# Patient Record
Sex: Male | Born: 1965 | Race: White | Hispanic: No | Marital: Married | Smoking: Never smoker
Health system: Southern US, Community
[De-identification: ages and names within clinical notes are randomized; demographics above are authoritative.]

## PROBLEM LIST (undated history)

## (undated) ENCOUNTER — Emergency Department (HOSPITAL_COMMUNITY): Payer: Self-pay

## (undated) DIAGNOSIS — C801 Malignant (primary) neoplasm, unspecified: Secondary | ICD-10-CM

## (undated) DIAGNOSIS — K5792 Diverticulitis of intestine, part unspecified, without perforation or abscess without bleeding: Secondary | ICD-10-CM

## (undated) DIAGNOSIS — N189 Chronic kidney disease, unspecified: Secondary | ICD-10-CM

## (undated) DIAGNOSIS — N2889 Other specified disorders of kidney and ureter: Secondary | ICD-10-CM

## (undated) DIAGNOSIS — E039 Hypothyroidism, unspecified: Secondary | ICD-10-CM

## (undated) HISTORY — PX: TONSILLECTOMY: SUR1361

## (undated) HISTORY — DX: Chronic kidney disease, unspecified: N18.9

---

## 2010-11-28 HISTORY — PX: THYROIDECTOMY: SHX17

## 2011-01-13 ENCOUNTER — Other Ambulatory Visit: Payer: Self-pay | Admitting: Surgery

## 2011-01-13 ENCOUNTER — Encounter (HOSPITAL_COMMUNITY): Payer: 59

## 2011-01-13 DIAGNOSIS — Z01811 Encounter for preprocedural respiratory examination: Secondary | ICD-10-CM | POA: Insufficient documentation

## 2011-01-13 DIAGNOSIS — Z01812 Encounter for preprocedural laboratory examination: Secondary | ICD-10-CM | POA: Insufficient documentation

## 2011-01-13 LAB — BASIC METABOLIC PANEL
CO2: 28 mEq/L (ref 19–32)
Calcium: 9.8 mg/dL (ref 8.4–10.5)
GFR calc Af Amer: >60 mL/min (ref >60)
GFR calc non Af Amer: >60 mL/min (ref >60)
Potassium: 4.2 mEq/L (ref 3.5–5.1)
Sodium: 141 mEq/L (ref 135–145)

## 2011-01-13 LAB — URINALYSIS, ROUTINE W REFLEX MICROSCOPIC
Hgb urine dipstick: NEGATIVE
Specific Gravity, Urine: 1.024 (ref 1.005–1.030)
Urobilinogen, UA: 0.2 mg/dL (ref 0.0–1.0)
pH: 6 (ref 5.0–8.0)

## 2011-01-13 LAB — DIFFERENTIAL
Lymphs Abs: 3.2 10*3/uL (ref 0.7–4.0)
Monocytes Absolute: 0.7 10*3/uL (ref 0.1–1.0)
Monocytes Relative: 6 % (ref 3–12)
Neutro Abs: 7.5 10*3/uL (ref 1.7–7.7)
Neutrophils Relative %: 64 % (ref 43–77)

## 2011-01-13 LAB — SURGICAL PCR SCREEN
MRSA, PCR: NEGATIVE
Staphylococcus aureus: NEGATIVE

## 2011-01-13 LAB — CBC
HCT: 43.3 % (ref 39.0–52.0)
Hemoglobin: 14.1 g/dL (ref 13.0–17.0)
MCH: 28.2 pg (ref 26.0–34.0)
RBC: 5 MIL/uL (ref 4.22–5.81)

## 2011-01-13 LAB — PROTIME-INR: INR: 0.99 (ref 0.00–1.49)

## 2011-01-20 ENCOUNTER — Other Ambulatory Visit: Payer: Self-pay | Admitting: Surgery

## 2011-01-20 ENCOUNTER — Ambulatory Visit (HOSPITAL_COMMUNITY)
Admission: RE | Admit: 2011-01-20 | Discharge: 2011-01-21 | Disposition: A | Discharge status: Home / Self Care | Payer: 59 | Source: Ambulatory Visit | Attending: Surgery | Admitting: Surgery

## 2011-01-20 DIAGNOSIS — C73 Malignant neoplasm of thyroid gland: Secondary | ICD-10-CM | POA: Insufficient documentation

## 2011-02-01 ENCOUNTER — Other Ambulatory Visit: Payer: Self-pay | Admitting: Surgery

## 2011-02-01 ENCOUNTER — Encounter (HOSPITAL_COMMUNITY): Payer: 59

## 2011-02-01 DIAGNOSIS — Z01812 Encounter for preprocedural laboratory examination: Secondary | ICD-10-CM | POA: Insufficient documentation

## 2011-02-01 LAB — SURGICAL PCR SCREEN
MRSA, PCR: NEGATIVE
Staphylococcus aureus: NEGATIVE

## 2011-02-01 LAB — CBC
Platelets: 344 10*3/uL (ref 150–400)
RBC: 5.06 MIL/uL (ref 4.22–5.81)
WBC: 11.3 10*3/uL — ABNORMAL HIGH (ref 4.0–10.5)

## 2011-02-02 NOTE — Op Note (Addendum)
NAME:  Ralph Garcia, Ralph Garcia NO.:  [id number]  MEDICAL RECORD NO.:  [id number]           PATIENT TYPE:  O  LOCATION:  1531                         FACILITY:  [facility]  PHYSICIAN:  Velora Heckler, MD      DATE OF BIRTH:  [birth year]  DATE OF PROCEDURE:  01/20/2011                               OPERATIVE REPORT   PREOPERATIVE DIAGNOSIS:  Right thyroid nodule with indeterminate cytopathology, 3.8 cm.  POSTOPERATIVE DIAGNOSIS:  Right thyroid nodule with indeterminate cytopathology, 3.8 cm.  PROCEDURE:  Right thyroid lobectomy.  SURGEON:  Velora Heckler, MD, FACS  ANESTHESIA:  General per Dr. Eilene Ghazi.  ESTIMATED BLOOD LOSS:  Minimal.  PREPARATION:  ChloraPrep.  COMPLICATIONS:  None.  INDICATIONS:  The patient is a 45 year old white male from [place], [place].  The patient had a chest x-ray in the emergency department, which showed tracheal deviation.  The patient subsequently underwent thyroid ultrasound in October 2011 showing an enlarged right thyroid lobe with a dominant 3.8 cm hypoechoic mass.  Biopsy was obtained and showed a follicular lesion with increased cellularity.  He was referred for consideration for right thyroid lobectomy for definitive diagnosis.  BODY OF REPORT:  Procedure was done in OR #1 at the [facility].  The patient was brought to the operating room, placed in the supine position on the operating room table.  Following administration of general anesthesia, the patient was positioned and then prepped and draped in the usual strict aseptic fashion.  After ascertaining that an adequate level of anesthesia been achieved, a Kocher incision was made with a #15 blade.  Dissection was carried through subcutaneous tissues and platysma.  Hemostasis was obtained with the electrocautery.  Subplatysmal flaps were developed cephalad and caudad and the Mahorner self-retaining retractor was placed for exposure.  Strap  muscles were incised in the midline.  Left lobe was examined.  It was grossly normal.  On palpation, there were no nodules. There was no lymphadenopathy.  We turned our attention to the right thyroid lobe.  Strap muscles were reflected laterally exposing a large right lobe of the thyroid gland. Middle thyroid vein was divided between Ligaclips with Harmonic scalpel. Superior pole was dissected out and superior pole vessels divided individually between medium Ligaclips with the Harmonic scalpel. Superior parathyroid gland is identified and preserved on its vascular pedicle.  Inferior venous tributaries were divided between medium Ligaclips with the Harmonic scalpel.  Gland is rolled anteriorly. Branches of the inferior thyroid artery are divided between small hemoclips with the Harmonic scalpel.  Recurrent nerve was identified and preserved along its course.  Ligament of Allyson Sabal was released with electrocautery and the gland was mobilized up and onto the anterior trachea.  Gland was completely mobilized off the anterior trachea and across the midline.  There is a moderate-sized pyramidal lobe, which was dissected off the thyroid cartilage and resected on block with the isthmus.  The isthmus was transected at its junction with the left thyroid lobe using the Harmonic scalpel to divide the parenchyma.  Good hemostasis was noted.  Specimen was submitted to  Pathology, labeled right thyroid lobe.  Neck is irrigated with warm saline, which was evacuated.  Good hemostasis was noted.  Surgicel was placed in the operative field. Strap muscles were reapproximated in the midline with interrupted 3-0 Vicryl sutures.  Platysma was closed with interrupted 3-0 Vicryl sutures.  Skin was closed with running 4-0 Monocryl subcuticular suture. Wound was washed and dried and benzoin and Steri-Strips were applied. Sterile dressings were applied.  The patient is awakened from anesthesia and brought to the  recovery room.  The patient tolerated the procedure well.     Velora Heckler, MD, FACS     TMG/MEDQ  D:  01/20/2011  T:  01/21/2011  Job:  [id number]  cc:   Dr. Ky Barban, [place]  Electronically Signed by Darnell Level MD on 02/01/2011 07:36:57 PM

## 2011-02-02 NOTE — Discharge Summary (Addendum)
  NAME:  Ralph Garcia, CARRAS NO.:  [id number]  MEDICAL RECORD NO.:  [id number]           PATIENT TYPE:  O  LOCATION:  1531                         FACILITY:  [facility]  PHYSICIAN:  Velora Heckler, MD      DATE OF BIRTH:  February 02, 1966  DATE OF ADMISSION:  01/20/2011 DATE OF DISCHARGE:  01/21/2011                              DISCHARGE SUMMARY   ADMITTING DIAGNOSIS:  Right thyroid nodule, follicular.  HISTORY OF PRESENT ILLNESS:  The patient is a 45 year old white male from [place], [place].  The patient was found incidentally to have tracheal deviation on chest x-ray.  Ultrasound demonstrated a dominant mass on the right thyroid lobe measuring 3.8 cm in greatest dimension.  Biopsy showed a follicular lesion with minimal colloid.  The patient now comes to surgery for resection for definitive diagnosis.  HOSPITAL COURSE:  The patient was admitted on January 20, 2011.  He was taken directly to the operating room where he underwent right thyroid lobectomy.  Postoperative course was uncomplicated.  The patient remained stable overnight without evidence of bleeding.  He tolerated a diet.  He was prepared for discharge home on the first postoperative day.  DISCHARGE PLANNING:  The patient is discharged home today, January 21, 2011, in good condition.  Discharge medications include Vicodin as needed for pain.  The patient will be seen back in my office at [facility] in 2-3 weeks.  We will obtain a TSH level in approximately 1 month.  FINAL DIAGNOSIS:  Right thyroid nodule, final pathology results pending at the time of discharge.  CONDITION AT DISCHARGE:  Good.     Velora Heckler, MD     TMG/MEDQ  D:  01/21/2011  T:  01/21/2011  Job:  [id number]  cc:   Foye Deer, M.D.  [facility]  Electronically Signed by Darnell Level MD on 02/01/2011 03:21:01 PM Electronically Signed by Darnell Level MD on 02/01/2011 04:54:09 PM Electronically  Signed by Darnell Level MD on 02/01/2011 03:54:23 PM Electronically Signed by Darnell Level MD on 02/01/2011 04:02:06 PM Electronically Signed by Darnell Level MD on 02/01/2011 04:33:15 PM Electronically Signed by Darnell Level MD on 02/01/2011 05:04:43 PM Electronically Signed by Darnell Level MD on 02/01/2011 05:04:43 PM Electronically Signed by Darnell Level MD on 02/01/2011 05:31:53 PM Electronically Signed by Darnell Level MD on 02/01/2011 05:31:53 PM Electronically Signed by Darnell Level MD on 02/01/2011 06:14:19 PM Electronically Signed by Darnell Level MD on 02/01/2011 06:22:00 PM Electronically Signed by Darnell Level MD on 02/01/2011 07:36:57 PM

## 2011-02-07 ENCOUNTER — Ambulatory Visit (HOSPITAL_COMMUNITY)
Admission: RE | Admit: 2011-02-07 | Discharge: 2011-02-08 | Disposition: A | Discharge status: Home / Self Care | Payer: 59 | Source: Ambulatory Visit | Attending: Surgery | Admitting: Surgery

## 2011-02-07 ENCOUNTER — Other Ambulatory Visit: Payer: Self-pay | Admitting: Surgery

## 2011-02-07 DIAGNOSIS — Z01818 Encounter for other preprocedural examination: Secondary | ICD-10-CM | POA: Insufficient documentation

## 2011-02-07 DIAGNOSIS — C73 Malignant neoplasm of thyroid gland: Secondary | ICD-10-CM | POA: Insufficient documentation

## 2011-02-07 DIAGNOSIS — Z01812 Encounter for preprocedural laboratory examination: Secondary | ICD-10-CM | POA: Insufficient documentation

## 2011-02-07 LAB — CALCIUM: Calcium: 9 mg/dL (ref 8.4–10.5)

## 2011-02-18 NOTE — Discharge Summary (Signed)
  NAME:  Ralph Garcia, RETH NO.:  [id number]  MEDICAL RECORD NO.:  [id number]           PATIENT TYPE:  O  LOCATION:  1531                         FACILITY:  [facility]  PHYSICIAN:  Velora Heckler, MD      DATE OF BIRTH:  1966/06/21  DATE OF ADMISSION:  02/07/2011 DATE OF DISCHARGE:  02/08/2011                              DISCHARGE SUMMARY   REASON FOR ADMISSION:  Thyroid carcinoma.  BRIEF HISTORY:  The patient is a 45 year old white male from [place], [place].  He underwent a right thyroid lobectomy on January 20, 2011.  Final pathology showed malignancy.  The patient now returns to the operating room for completion thyroidectomy.  HOSPITAL COURSE:  The patient was admitted on February 07, 2011.  He was taken directly to the operating room where he underwent completion thyroidectomy.  Postoperative course was uncomplicated.  Calcium levels were 9.0 on the evening of surgery and 8.5 on the morning following surgery.  Voice quality was good.  The patient was prepared for discharge home on the first postoperative day.  DISCHARGE PLANNING:  The patient is discharged home today, February 08 2011, in good condition, tolerating a regular diet, and ambulating independently.  DISCHARGE MEDICATIONS:  Include Vicodin as needed for pain and Tums 2 tablets 3 times daily.  The patient will not start on thyroid hormone supplementation until he is evaluated by Dr. Talmage Coin.  The patient will be seen back in my office in 2 weeks.  A calcium level will be checked prior to that office visit.  FOLLOWUP:  Consultation With Dr. Talmage Coin of [facility] will be arranged in the immediate future.  FINAL DIAGNOSIS:  Thyroid carcinoma.  CONDITION ON DISCHARGE:  Good.     Velora Heckler, MD     TMG/MEDQ  D:  02/08/2011  T:  02/08/2011  Job:  [id number]  cc:   Dr. Foye Deer  Electronically Signed by Darnell Level MD on 02/18/2011 09:18:04 AM

## 2011-02-18 NOTE — Op Note (Signed)
NAME:  Ralph Garcia, Ralph Garcia NO.:  [id number]  MEDICAL RECORD NO.:  [id number]           PATIENT TYPE:  O  LOCATION:  DAYL                         FACILITY:  [facility]  PHYSICIAN:  Velora Heckler, MD      DATE OF BIRTH:  Aug 14, 1966  DATE OF PROCEDURE: DATE OF DISCHARGE:                              OPERATIVE REPORT   PREOPERATIVE DIAGNOSIS:  Thyroid carcinoma.  POSTOPERATIVE DIAGNOSIS:  Thyroid carcinoma.  PROCEDURE:  Completion thyroidectomy.  SURGEON:  Velora Heckler, MD, FACS  ASSISTANT:  Cyndia Bent, MD  ANESTHESIA:  General per Dr. Eilene Ghazi.  ESTIMATED BLOOD LOSS:  Minimal.  PREPARATION:  ChloraPrep.  COMPLICATIONS:  None.  INDICATIONS:  Patient is a 45 year old white male from [place], [place].  He presented initially with an enlarged right thyroid lobe. It was resected on February 23.  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  The patient now returns to the operating room for completion thyroidectomy to facilitate adjuvant treatment with radioactive iodine.  PROCEDURE IN DETAIL:  Procedure was done in OR #11 at the [facility].  The patient was brought to the operating room, placed in supine position on the operating room table.  Following administration of general anesthesia, the patient is positioned and then prepped and draped in the usual strict aseptic fashion.  After ascertaining that an adequate level of anesthesia been achieved, the patient's previous Kocher incision was reopened with a #15 blade. Dissection was carried through subcutaneous tissues.  Small seroma is evacuated from in front of the strap muscles.  Planes are redeveloped and Mahorner self-retaining retractor was placed for exposure.  Strap muscles were again incised in the midline.  Again, a small seroma was noted anterior to the airway.  Strap muscles were opened widely.  Strap muscles were reflected  to the left exposing the left thyroid lobe. There was moderate scarring.  This was freed with electrocautery.  Strap muscles were reflected further laterally exposing the left lobe.  It was gently dissected out.  Superior pole vessels were divided individually between small and medium Ligaclips with a harmonic scalpel.  Gland is rolled anteriorly.  Superior parathyroid gland was identified and preserved on its vascular pedicle.  Branches of the inferior thyroid artery were divided between small Ligaclips with the harmonic scalpel. Inferior venous tributaries were divided between small Ligaclips with the harmonic scalpel.  Gland is rolled anteriorly.  Ligament of Allyson Sabal was released with electrocautery.  A small amount of residual thyroid tissue was left in situ immediately adjacent to the recurrent nerve. Remainder of the gland was then excised off the airway with electrocautery used for hemostasis.  Left thyroid lobe was submitted to pathology for permanent review.  Neck was irrigated with warm saline.  Good hemostasis was noted.  Surgicel was placed in the operative field.  Strap muscles were reapproximated in the midline with interrupted 3-0 Vicryl sutures.  Platysma was closed with interrupted 3-0 Vicryl sutures.  Skin was closed with running 4-0 Monocryl subcuticular suture.  Wound is washed and dried and Benzoin and Steri-Strips  were applied.  Sterile dressings were applied.  The patient is awakened from anesthesia and brought to the recovery room.  The patient tolerated the procedure well.   Velora Heckler, MD, FACS     TMG/MEDQ  D:  02/07/2011  T:  02/07/2011  Job:  [id number]  cc:   Dr. Foye Deer  Tonita Cong, M.D. Fax: [phone]  Electronically Signed by Darnell Level MD on 02/18/2011 09:18:34 AM

## 2011-02-22 ENCOUNTER — Other Ambulatory Visit (HOSPITAL_COMMUNITY): Payer: Self-pay | Admitting: Internal Medicine

## 2011-02-22 DIAGNOSIS — E0590 Thyrotoxicosis, unspecified without thyrotoxic crisis or storm: Secondary | ICD-10-CM

## 2011-02-22 DIAGNOSIS — C73 Malignant neoplasm of thyroid gland: Secondary | ICD-10-CM

## 2011-03-03 ENCOUNTER — Other Ambulatory Visit (HOSPITAL_COMMUNITY): Payer: Self-pay | Admitting: Internal Medicine

## 2011-03-03 DIAGNOSIS — C73 Malignant neoplasm of thyroid gland: Secondary | ICD-10-CM

## 2011-03-16 ENCOUNTER — Other Ambulatory Visit (HOSPITAL_COMMUNITY): Payer: Self-pay | Admitting: Internal Medicine

## 2011-03-16 DIAGNOSIS — C73 Malignant neoplasm of thyroid gland: Secondary | ICD-10-CM

## 2011-03-21 ENCOUNTER — Encounter (HOSPITAL_COMMUNITY)
Admission: RE | Admit: 2011-03-21 | Discharge: 2011-03-21 | Disposition: A | Discharge status: Home / Self Care | Payer: 59 | Source: Ambulatory Visit | Attending: Internal Medicine | Admitting: Internal Medicine

## 2011-03-21 DIAGNOSIS — C73 Malignant neoplasm of thyroid gland: Secondary | ICD-10-CM

## 2011-03-22 ENCOUNTER — Encounter (HOSPITAL_COMMUNITY): Payer: 59

## 2011-03-22 ENCOUNTER — Ambulatory Visit (HOSPITAL_COMMUNITY): Payer: 59

## 2011-03-22 ENCOUNTER — Encounter (HOSPITAL_COMMUNITY)
Admission: RE | Admit: 2011-03-22 | Discharge: 2011-03-22 | Disposition: A | Discharge status: Home / Self Care | Payer: 59 | Source: Ambulatory Visit | Attending: Internal Medicine | Admitting: Internal Medicine

## 2011-03-22 DIAGNOSIS — C73 Malignant neoplasm of thyroid gland: Secondary | ICD-10-CM | POA: Insufficient documentation

## 2011-03-23 ENCOUNTER — Encounter (HOSPITAL_COMMUNITY)
Admission: RE | Admit: 2011-03-23 | Discharge: 2011-03-23 | Disposition: A | Discharge status: Home / Self Care | Payer: 59 | Source: Ambulatory Visit | Attending: Internal Medicine | Admitting: Internal Medicine

## 2011-03-23 DIAGNOSIS — C73 Malignant neoplasm of thyroid gland: Secondary | ICD-10-CM | POA: Insufficient documentation

## 2011-03-23 MED ORDER — SODIUM IODIDE I 131 CAPSULE
107.6000 | Freq: Once | INTRAVENOUS | Status: AC | PRN
  Administered 2011-03-23: 107.6 via ORAL

## 2011-03-28 ENCOUNTER — Encounter (INDEPENDENT_AMBULATORY_CARE_PROVIDER_SITE_OTHER): Payer: Self-pay | Admitting: Surgery

## 2011-04-01 ENCOUNTER — Ambulatory Visit (HOSPITAL_COMMUNITY)
Admission: RE | Admit: 2011-04-01 | Discharge: 2011-04-01 | Disposition: A | Discharge status: Home / Self Care | Payer: 59 | Source: Ambulatory Visit | Attending: Internal Medicine | Admitting: Internal Medicine

## 2011-04-01 DIAGNOSIS — C73 Malignant neoplasm of thyroid gland: Secondary | ICD-10-CM | POA: Insufficient documentation

## 2011-08-08 ENCOUNTER — Ambulatory Visit (INDEPENDENT_AMBULATORY_CARE_PROVIDER_SITE_OTHER): Payer: 59 | Admitting: Surgery

## 2011-08-10 ENCOUNTER — Encounter (INDEPENDENT_AMBULATORY_CARE_PROVIDER_SITE_OTHER): Payer: Self-pay | Admitting: Surgery

## 2011-09-30 ENCOUNTER — Other Ambulatory Visit: Payer: Self-pay | Admitting: Internal Medicine

## 2011-09-30 DIAGNOSIS — C73 Malignant neoplasm of thyroid gland: Secondary | ICD-10-CM

## 2011-10-04 ENCOUNTER — Ambulatory Visit
Admission: RE | Admit: 2011-10-04 | Discharge: 2011-10-04 | Disposition: A | Discharge status: Home / Self Care | Payer: 59 | Source: Ambulatory Visit | Attending: Internal Medicine | Admitting: Internal Medicine

## 2011-10-04 DIAGNOSIS — C73 Malignant neoplasm of thyroid gland: Secondary | ICD-10-CM

## 2012-10-18 ENCOUNTER — Other Ambulatory Visit: Payer: Self-pay | Admitting: Internal Medicine

## 2012-10-18 DIAGNOSIS — C73 Malignant neoplasm of thyroid gland: Secondary | ICD-10-CM

## 2012-10-29 ENCOUNTER — Ambulatory Visit
Admission: RE | Admit: 2012-10-29 | Discharge: 2012-10-29 | Disposition: A | Discharge status: Home / Self Care | Payer: 59 | Source: Ambulatory Visit | Attending: Internal Medicine | Admitting: Internal Medicine

## 2012-10-29 DIAGNOSIS — C73 Malignant neoplasm of thyroid gland: Secondary | ICD-10-CM

## 2013-03-28 ENCOUNTER — Other Ambulatory Visit: Payer: Self-pay | Admitting: Urology

## 2013-04-17 ENCOUNTER — Encounter (HOSPITAL_COMMUNITY): Payer: Self-pay | Admitting: Pharmacy Technician

## 2013-04-26 ENCOUNTER — Ambulatory Visit (HOSPITAL_COMMUNITY)
Admission: RE | Admit: 2013-04-26 | Discharge: 2013-04-26 | Disposition: A | Discharge status: Home / Self Care | Payer: 59 | Source: Ambulatory Visit | Attending: Urology | Admitting: Urology

## 2013-04-26 ENCOUNTER — Encounter (HOSPITAL_COMMUNITY): Payer: Self-pay

## 2013-04-26 ENCOUNTER — Encounter (HOSPITAL_COMMUNITY)
Admission: RE | Admit: 2013-04-26 | Discharge: 2013-04-26 | Disposition: A | Discharge status: Home / Self Care | Payer: 59 | Source: Ambulatory Visit | Attending: Urology | Admitting: Urology

## 2013-04-26 DIAGNOSIS — I10 Essential (primary) hypertension: Secondary | ICD-10-CM | POA: Insufficient documentation

## 2013-04-26 DIAGNOSIS — Z01818 Encounter for other preprocedural examination: Secondary | ICD-10-CM | POA: Insufficient documentation

## 2013-04-26 DIAGNOSIS — Z01812 Encounter for preprocedural laboratory examination: Secondary | ICD-10-CM | POA: Insufficient documentation

## 2013-04-26 HISTORY — DX: Malignant (primary) neoplasm, unspecified: C80.1

## 2013-04-26 HISTORY — DX: Hypothyroidism, unspecified: E03.9

## 2013-04-26 LAB — CBC
HCT: 40.2 % (ref 39.0–52.0)
MCHC: 33.6 g/dL (ref 30.0–36.0)
MCV: 85.2 fL (ref 78.0–100.0)
RDW: 12.3 % (ref 11.5–15.5)

## 2013-04-26 LAB — BASIC METABOLIC PANEL
BUN: 14 mg/dL (ref 6–23)
Calcium: 8.9 mg/dL (ref 8.4–10.5)
Chloride: 105 mEq/L (ref 96–112)
Creatinine, Ser: 0.86 mg/dL (ref 0.50–1.35)
GFR calc Af Amer: >90 mL/min (ref >90)

## 2013-04-26 LAB — SURGICAL PCR SCREEN: MRSA, PCR: NEGATIVE

## 2013-04-26 NOTE — Patient Instructions (Addendum)
[address] PEPLINSKI  04/26/2013   Your procedure is scheduled on: 05/02/13  Report to [facility] at 0515 AM.  Call this number if you have problems the morning of surgery [phone]   Remember:   Do not eat food or drink liquids After Midnight.     Take these medicines the morning of surgery with A SIP OF WATER: synthroid   Do not wear jewelry, make-up or nail polish.  Do not wear lotions, powders, or perfumes. You may wear deodorant.  Do not shave 48 hours prior to surgery. Men may shave face and neck.  Do not bring valuables to the hospital.  Contacts, dentures or bridgework may not be worn into surgery.  Leave suitcase in the car. After surgery it may be brought to your room.  For patients admitted to the hospital, checkout time is 11:00 AM the day of discharge.    Please read over the following fact sheets that you were given: MRSA Information, blood fact sheet, incentive spirometry fact sheet  Birdie Sons, RN  pre op nurse call if needed [phone]    FAILURE TO FOLLOW THESE INSTRUCTIONS MAY RESULT IN CANCELLATION OF YOUR SURGERY   Patient Signature: ___________________________________________

## 2013-04-26 NOTE — Progress Notes (Signed)
EKG 07/21/12 on chart

## 2013-05-01 NOTE — Anesthesia Preprocedure Evaluation (Addendum)
Anesthesia Evaluation  Patient identified by MRN, date of birth, ID band Patient awake    Reviewed: Allergy & Precautions, H&P , NPO status , Patient's Chart, lab work & pertinent test results  Airway Mallampati: II TM Distance: >3 FB Neck ROM: Full    Dental  (+) Teeth Intact and Dental Advisory Given   Pulmonary neg pulmonary ROS,  breath sounds clear to auscultation  Pulmonary exam normal       Cardiovascular negative cardio ROS  Rhythm:Regular Rate:Normal     Neuro/Psych negative neurological ROS  negative psych ROS   GI/Hepatic negative GI ROS, Neg liver ROS,   Endo/Other  Hypothyroidism Thyroid CA  Renal/GU negative Renal ROS  negative genitourinary   Musculoskeletal negative musculoskeletal ROS (+)   Abdominal   Peds  Hematology negative hematology ROS (+)   Anesthesia Other Findings   Reproductive/Obstetrics                          Anesthesia Physical Anesthesia Plan  ASA: III  Anesthesia Plan: General   Post-op Pain Management:    Induction: Intravenous  Airway Management Planned: Oral ETT  Additional Equipment:   Intra-op Plan:   Post-operative Plan: Extubation in OR  Informed Consent: I have reviewed the patients History and Physical, chart, labs and discussed the procedure including the risks, benefits and alternatives for the proposed anesthesia with the patient or authorized representative who has indicated his/her understanding and acceptance.   Dental advisory given  Plan Discussed with: CRNA  Anesthesia Plan Comments:         Anesthesia Quick Evaluation

## 2013-05-01 NOTE — H&P (Signed)
  History of Present Illness  Ralph Garcia is a 46 year old who was initially evaluated for an elevated PSA of 3.6 in October 2013. This was repeated and remained elevated at 4.57.  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 He has no family history of prostate cancer but does have a maternal family history of breast cancer with his mother having been diagnosed at an early age and ultimately dying of metastatic breast cancer.   Overall he his healthy with minimal comorbidities.  TNM stage: cT1c Nx Mx PSA: 4.57 Gleason score: 3+3=6 Biopsy (03/06/13): 3/12 cores positive    Left: L lateral apex (< 5%)    Right: R mid (5%), R lateral mid (5%) Prostate volume: 59.4 cc PSAD: 0.08  Nomogram: OC disease: 89% EPE: 8% SVI: 1% LNI: 1.3% PFS (surgery): 98% at 5 years, 97% at 10 years DSS: 99% at 5 years, 99% at 10 years  Urinary function: He has baseline urinary urgency, frequency,and urge incontinence. IPSS is 10. Erectile function: SHIM score is 21.  Past Medical History Problems  1. History of  Thyroid Cancer V10.87  Surgical History Problems  1. History of  Thyroid Surgery Total Thyroidectomy 2. History of  Tonsillectomy  Current Meds 1. Levothyroxine Sodium 175 MCG Oral Tablet; Therapy: 20Nov2013 to  Allergies Medication  1. Erythromycin GEL 2. Neomycin-Polymyxin-Bacitracin OINT  Family History Problems  1. Maternal history of  Breast Cancer V16.3 2. Paternal history of  Multiple Myeloma  Social History Problems  1. Marital History - Currently Married 2. Never A Smoker Denied  3. History of  Alcohol Use  Vitals Vital Signs [Data Includes: Last 1 Day]  29Apr2014 08:59AM  Blood Pressure: 131 / 75 Temperature: 98.6 F Heart Rate: 73  Physical Exam Constitutional: Well nourished and well developed  . No acute distress.  Pulmonary: No respiratory distress and normal respiratory rhythm and effort.  Cardiovascular: Heart rate and rhythm are normal . No peripheral edema.  Abdomen: The abdomen is soft and nontender. No masses are palpated.  Neuro/Psych:. Mood and affect are appropriate.    Assessment Assessed  1. Prostate Cancer 185    Discussion/Summary  1. Prostate cancer: He has elected to undergo surgical therapy and will proceed with a robotic-assisted laparoscopic radical prostatectomy. I discussed the potential benefits and risks of the procedure, side effects of the proposed treatment, the likelihood of the patient achieving the goals of the procedure, and any potential problems that might occur during the procedure or recuperation.

## 2013-05-02 ENCOUNTER — Ambulatory Visit (HOSPITAL_COMMUNITY): Payer: 59 | Admitting: Anesthesiology

## 2013-05-02 ENCOUNTER — Encounter (HOSPITAL_COMMUNITY): Payer: Self-pay | Admitting: *Deleted

## 2013-05-02 ENCOUNTER — Encounter (HOSPITAL_COMMUNITY): Payer: Self-pay | Admitting: Anesthesiology

## 2013-05-02 ENCOUNTER — Encounter (HOSPITAL_COMMUNITY)
Admission: RE | Disposition: A | Discharge status: Home / Self Care | Payer: Self-pay | Source: Ambulatory Visit | Attending: Urology

## 2013-05-02 ENCOUNTER — Inpatient Hospital Stay (HOSPITAL_COMMUNITY)
Admission: RE | Admit: 2013-05-02 | Discharge: 2013-05-03 | DRG: 708 | Disposition: A | Discharge status: Home / Self Care | Payer: 59 | Source: Ambulatory Visit | Attending: Urology | Admitting: Urology

## 2013-05-02 DIAGNOSIS — N3941 Urge incontinence: Secondary | ICD-10-CM | POA: Diagnosis present

## 2013-05-02 DIAGNOSIS — Z803 Family history of malignant neoplasm of breast: Secondary | ICD-10-CM

## 2013-05-02 DIAGNOSIS — C61 Malignant neoplasm of prostate: Principal | ICD-10-CM | POA: Diagnosis present

## 2013-05-02 DIAGNOSIS — E0789 Other specified disorders of thyroid: Secondary | ICD-10-CM | POA: Diagnosis present

## 2013-05-02 DIAGNOSIS — Z85850 Personal history of malignant neoplasm of thyroid: Secondary | ICD-10-CM

## 2013-05-02 HISTORY — PX: ROBOT ASSISTED LAPAROSCOPIC RADICAL PROSTATECTOMY: SHX5141

## 2013-05-02 LAB — ABO/RH: ABO/RH(D): A POS

## 2013-05-02 LAB — TYPE AND SCREEN
ABO/RH(D): A POS
Antibody Screen: NEGATIVE

## 2013-05-02 SURGERY — ROBOTIC ASSISTED LAPAROSCOPIC RADICAL PROSTATECTOMY LEVEL 1
Anesthesia: General | Site: Pelvis | Wound class: Clean Contaminated

## 2013-05-02 MED ORDER — STERILE WATER FOR IRRIGATION IR SOLN
Status: DC | PRN
  Administered 2013-05-02: 1500 mL

## 2013-05-02 MED ORDER — FENTANYL CITRATE 0.05 MG/ML IJ SOLN
INTRAMUSCULAR | Status: DC | PRN
  Administered 2013-05-02: 150 ug via INTRAVENOUS
  Administered 2013-05-02: 50 ug via INTRAVENOUS

## 2013-05-02 MED ORDER — SODIUM CHLORIDE 0.9 % IV BOLUS (SEPSIS)
1000.0000 mL | Freq: Once | INTRAVENOUS | Status: AC
  Administered 2013-05-02: 1000 mL via INTRAVENOUS

## 2013-05-02 MED ORDER — PROMETHAZINE HCL 25 MG/ML IJ SOLN: 6.2500 mg | INTRAMUSCULAR | Status: DC | PRN

## 2013-05-02 MED ORDER — DIPHENHYDRAMINE HCL 50 MG/ML IJ SOLN: 12.5000 mg | Freq: Four times a day (QID) | INTRAMUSCULAR | Status: DC | PRN

## 2013-05-02 MED ORDER — BUPIVACAINE-EPINEPHRINE 0.25% -1:200000 IJ SOLN
INTRAMUSCULAR | Status: DC | PRN
  Administered 2013-05-02: 30 mL

## 2013-05-02 MED ORDER — CEFAZOLIN SODIUM 1-5 GM-% IV SOLN
1.0000 g | Freq: Three times a day (TID) | INTRAVENOUS | Status: AC
  Administered 2013-05-02 (×2): 1 g via INTRAVENOUS
  Filled 2013-05-02 (×2): qty 50

## 2013-05-02 MED ORDER — ONDANSETRON HCL 4 MG/2ML IJ SOLN
INTRAMUSCULAR | Status: DC | PRN
  Administered 2013-05-02: 4 mg via INTRAVENOUS

## 2013-05-02 MED ORDER — DOCUSATE SODIUM 100 MG PO CAPS
100.0000 mg | ORAL_CAPSULE | Freq: Two times a day (BID) | ORAL | Status: DC
  Administered 2013-05-02 – 2013-05-03 (×3): 100 mg via ORAL
  Filled 2013-05-02 (×4): qty 1

## 2013-05-02 MED ORDER — KCL IN DEXTROSE-NACL 20-5-0.45 MEQ/L-%-% IV SOLN
INTRAVENOUS | Status: DC
  Administered 2013-05-02 (×3): via INTRAVENOUS
  Filled 2013-05-02 (×5): qty 1000

## 2013-05-02 MED ORDER — CIPROFLOXACIN HCL 500 MG PO TABS: 500.0000 mg | ORAL_TABLET | Freq: Two times a day (BID) | ORAL | Status: DC

## 2013-05-02 MED ORDER — HEMOSTATIC AGENTS (NO CHARGE) OPTIME
TOPICAL | Status: DC | PRN
  Administered 2013-05-02: 1 via TOPICAL

## 2013-05-02 MED ORDER — DIPHENHYDRAMINE HCL 12.5 MG/5ML PO ELIX: 12.5000 mg | ORAL_SOLUTION | Freq: Four times a day (QID) | ORAL | Status: DC | PRN

## 2013-05-02 MED ORDER — HYDROCODONE-ACETAMINOPHEN 5-325 MG PO TABS: 1.0000 | ORAL_TABLET | Freq: Four times a day (QID) | ORAL | Status: DC | PRN

## 2013-05-02 MED ORDER — GLYCOPYRROLATE 0.2 MG/ML IJ SOLN
INTRAMUSCULAR | Status: DC | PRN
  Administered 2013-05-02: 0.4 mg via INTRAVENOUS

## 2013-05-02 MED ORDER — MORPHINE SULFATE 2 MG/ML IJ SOLN
2.0000 mg | INTRAMUSCULAR | Status: DC | PRN
  Administered 2013-05-02: 2 mg via INTRAVENOUS
  Filled 2013-05-02: qty 1

## 2013-05-02 MED ORDER — NEOSTIGMINE METHYLSULFATE 1 MG/ML IJ SOLN
INTRAMUSCULAR | Status: DC | PRN
  Administered 2013-05-02: 2 mg via INTRAVENOUS

## 2013-05-02 MED ORDER — KCL IN DEXTROSE-NACL 20-5-0.45 MEQ/L-%-% IV SOLN
INTRAVENOUS | Status: AC
  Filled 2013-05-02: qty 1000

## 2013-05-02 MED ORDER — KETOROLAC TROMETHAMINE 15 MG/ML IJ SOLN
15.0000 mg | Freq: Four times a day (QID) | INTRAMUSCULAR | Status: DC
  Administered 2013-05-02 – 2013-05-03 (×4): 15 mg via INTRAVENOUS
  Filled 2013-05-02 (×6): qty 1

## 2013-05-02 MED ORDER — SODIUM CHLORIDE 0.9 % IR SOLN
Status: DC | PRN
  Administered 2013-05-02: 1 via INTRAVESICAL

## 2013-05-02 MED ORDER — ACETAMINOPHEN 325 MG PO TABS: 650.0000 mg | ORAL_TABLET | ORAL | Status: DC | PRN

## 2013-05-02 MED ORDER — INDIGOTINDISULFONATE SODIUM 8 MG/ML IJ SOLN
INTRAMUSCULAR | Status: DC | PRN
  Administered 2013-05-02: 5 mL via INTRAVENOUS

## 2013-05-02 MED ORDER — CEFAZOLIN SODIUM-DEXTROSE 2-3 GM-% IV SOLR
INTRAVENOUS | Status: AC
  Filled 2013-05-02: qty 50

## 2013-05-02 MED ORDER — SUCCINYLCHOLINE CHLORIDE 20 MG/ML IJ SOLN
INTRAMUSCULAR | Status: DC | PRN
  Administered 2013-05-02: 100 mg via INTRAVENOUS

## 2013-05-02 MED ORDER — HEPARIN SODIUM (PORCINE) 1000 UNIT/ML IJ SOLN
INTRAMUSCULAR | Status: AC
  Filled 2013-05-02: qty 1

## 2013-05-02 MED ORDER — INDIGOTINDISULFONATE SODIUM 8 MG/ML IJ SOLN
INTRAMUSCULAR | Status: AC
  Filled 2013-05-02: qty 10

## 2013-05-02 MED ORDER — PROPOFOL 10 MG/ML IV BOLUS
INTRAVENOUS | Status: DC | PRN
  Administered 2013-05-02: 200 mg via INTRAVENOUS

## 2013-05-02 MED ORDER — HYDROMORPHONE HCL PF 1 MG/ML IJ SOLN
INTRAMUSCULAR | Status: AC
  Filled 2013-05-02: qty 1

## 2013-05-02 MED ORDER — HYDROMORPHONE HCL PF 1 MG/ML IJ SOLN
INTRAMUSCULAR | Status: DC | PRN
  Administered 2013-05-02 (×2): 1 mg via INTRAVENOUS

## 2013-05-02 MED ORDER — ACETAMINOPHEN 10 MG/ML IV SOLN
INTRAVENOUS | Status: AC
  Filled 2013-05-02: qty 100

## 2013-05-02 MED ORDER — LIDOCAINE HCL 1 % IJ SOLN
INTRAMUSCULAR | Status: DC | PRN
  Administered 2013-05-02: 100 mg via INTRADERMAL

## 2013-05-02 MED ORDER — MIDAZOLAM HCL 5 MG/5ML IJ SOLN
INTRAMUSCULAR | Status: DC | PRN
  Administered 2013-05-02: 2 mg via INTRAVENOUS

## 2013-05-02 MED ORDER — ACETAMINOPHEN 10 MG/ML IV SOLN
INTRAVENOUS | Status: DC | PRN
  Administered 2013-05-02: 1000 mg via INTRAVENOUS

## 2013-05-02 MED ORDER — LACTATED RINGERS IV SOLN: INTRAVENOUS | Status: DC

## 2013-05-02 MED ORDER — CEFAZOLIN SODIUM-DEXTROSE 2-3 GM-% IV SOLR
2.0000 g | INTRAVENOUS | Status: AC
  Administered 2013-05-02: 2 g via INTRAVENOUS

## 2013-05-02 MED ORDER — LACTATED RINGERS IV SOLN
INTRAVENOUS | Status: DC | PRN
  Administered 2013-05-02: 09:00:00

## 2013-05-02 MED ORDER — HYDROMORPHONE HCL PF 1 MG/ML IJ SOLN
0.2500 mg | INTRAMUSCULAR | Status: DC | PRN
  Administered 2013-05-02 (×2): 0.5 mg via INTRAVENOUS

## 2013-05-02 MED ORDER — BUPIVACAINE-EPINEPHRINE PF 0.25-1:200000 % IJ SOLN
INTRAMUSCULAR | Status: AC
  Filled 2013-05-02: qty 30

## 2013-05-02 MED ORDER — LACTATED RINGERS IV SOLN
INTRAVENOUS | Status: DC | PRN
  Administered 2013-05-02 (×2): via INTRAVENOUS

## 2013-05-02 MED ORDER — CISATRACURIUM BESYLATE (PF) 10 MG/5ML IV SOLN
INTRAVENOUS | Status: DC | PRN
  Administered 2013-05-02: 6 mg via INTRAVENOUS
  Administered 2013-05-02: 10 mg via INTRAVENOUS

## 2013-05-02 MED ORDER — LEVOTHYROXINE SODIUM 175 MCG PO TABS
175.0000 ug | ORAL_TABLET | Freq: Every day | ORAL | Status: DC
  Filled 2013-05-02 (×2): qty 1

## 2013-05-02 MED ORDER — INDIGOTINDISULFONATE SODIUM 8 MG/ML IJ SOLN: INTRAMUSCULAR | Status: DC | PRN

## 2013-05-02 SURGICAL SUPPLY — 44 items
CANISTER SUCTION 2500CC (MISCELLANEOUS) ×2 IMPLANT
CATH FOLEY 2WAY SLVR 18FR 30CC (CATHETERS) ×2 IMPLANT
CATH ROBINSON RED A/P 16FR (CATHETERS) ×2 IMPLANT
CATH ROBINSON RED A/P 8FR (CATHETERS) ×2 IMPLANT
CATH TIEMANN FOLEY 18FR 5CC (CATHETERS) ×2 IMPLANT
CHLORAPREP W/TINT 26ML (MISCELLANEOUS) ×2 IMPLANT
CLIP LIGATING HEM O LOK PURPLE (MISCELLANEOUS) IMPLANT
CLOTH BEACON ORANGE TIMEOUT ST (SAFETY) ×2 IMPLANT
CORD HIGH FREQUENCY UNIPOLAR (ELECTROSURGICAL) ×2 IMPLANT
COVER SURGICAL LIGHT HANDLE (MISCELLANEOUS) ×2 IMPLANT
COVER TIP SHEARS 8 DVNC (MISCELLANEOUS) ×1 IMPLANT
COVER TIP SHEARS 8MM DA VINCI (MISCELLANEOUS) ×1
CUTTER ECHEON FLEX ENDO 45 340 (ENDOMECHANICALS) ×2 IMPLANT
DECANTER SPIKE VIAL GLASS SM (MISCELLANEOUS) IMPLANT
DRAPE SURG IRRIG POUCH 19X23 (DRAPES) ×2 IMPLANT
DRSG TEGADERM 2-3/8X2-3/4 SM (GAUZE/BANDAGES/DRESSINGS) ×8 IMPLANT
DRSG TEGADERM 4X4.75 (GAUZE/BANDAGES/DRESSINGS) ×4 IMPLANT
DRSG TEGADERM 6X8 (GAUZE/BANDAGES/DRESSINGS) ×4 IMPLANT
ELECT REM PT RETURN 9FT ADLT (ELECTROSURGICAL) ×2
ELECTRODE REM PT RTRN 9FT ADLT (ELECTROSURGICAL) ×1 IMPLANT
GAUZE SPONGE 2X2 8PLY STRL LF (GAUZE/BANDAGES/DRESSINGS) ×1 IMPLANT
GLOVE BIO SURGEON STRL SZ 6.5 (GLOVE) ×2 IMPLANT
GLOVE BIOGEL M STRL SZ7.5 (GLOVE) ×4 IMPLANT
GOWN STRL NON-REIN LRG LVL3 (GOWN DISPOSABLE) ×4 IMPLANT
GOWN STRL REIN XL XLG (GOWN DISPOSABLE) ×4 IMPLANT
HEMOSTAT SURGICEL 2X14 (HEMOSTASIS) ×2 IMPLANT
HOLDER FOLEY CATH W/STRAP (MISCELLANEOUS) ×2 IMPLANT
IV LACTATED RINGERS 1000ML (IV SOLUTION) ×2 IMPLANT
KIT ACCESSORY DA VINCI DISP (KITS) ×1
KIT ACCESSORY DVNC DISP (KITS) ×1 IMPLANT
NDL SAFETY ECLIPSE 18X1.5 (NEEDLE) ×1 IMPLANT
NEEDLE HYPO 18GX1.5 SHARP (NEEDLE) ×1
PACK ROBOT UROLOGY CUSTOM (CUSTOM PROCEDURE TRAY) ×2 IMPLANT
RELOAD GREEN ECHELON 45 (STAPLE) ×2 IMPLANT
SET TUBE IRRIG SUCTION NO TIP (IRRIGATION / IRRIGATOR) ×2 IMPLANT
SOLUTION ELECTROLUBE (MISCELLANEOUS) ×2 IMPLANT
SPONGE GAUZE 2X2 STER 10/PKG (GAUZE/BANDAGES/DRESSINGS) ×1
SUT ETHILON 3 0 PS 1 (SUTURE) ×2 IMPLANT
SUT MNCRL 3 0 RB1 (SUTURE) ×1 IMPLANT
SUT MONOCRYL 3 0 RB1 (SUTURE) ×1
SUT VICRYL 0 UR6 27IN ABS (SUTURE) ×4 IMPLANT
SYR 27GX1/2 1ML LL SAFETY (SYRINGE) ×2 IMPLANT
TOWEL OR NON WOVEN STRL DISP B (DISPOSABLE) ×2 IMPLANT
WATER STERILE IRR 1500ML POUR (IV SOLUTION) ×4 IMPLANT

## 2013-05-02 NOTE — Progress Notes (Signed)
Patient ID: Ralph Garcia, male   DOB: [birth year], 47 y.o.   MRN: [id number] Post-op note  Subjective: The patient is doing well.  No complaints.  Denies N/V  Objective: Vital signs in last 24 hours: Temp:  [97.5 F (36.4 C)-98.3 F (36.8 C)] 97.6 F (36.4 C) (06/05 1040) Pulse Rate:  [51-70] 57 (06/05 1040) Resp:  [10-18] 14 (06/05 1040) BP: (123-135)/(68-81) 133/81 mmHg (06/05 1040) SpO2:  [97 %-100 %] 100 % (06/05 1040)  Intake/Output from previous day:   Intake/Output this shift: Total I/O In: 1200 [I.V.:1200] Out: 155 [Urine:60; Drains:20; Blood:75]  Physical Exam:  General: Alert and oriented. Abdomen: Soft, Nondistended. Incisions: Clean and dry.  Lab Results:  Recent Labs  05/02/13 0940  HGB 14.1  HCT 42.1    Assessment/Plan: POD#0   1) Continue to monitor 2) DVT prophy, amb, IS, pain control     LOS: 0 days   YARBROUGH,Janene Yousuf G. 05/02/2013, 1:18 PM

## 2013-05-02 NOTE — Anesthesia Procedure Notes (Signed)
Procedure Name: Intubation Date/Time: 05/02/2013 7:27 AM Performed by: Einar Pheasant Pre-anesthesia Checklist: Patient identified, Emergency Drugs available, Suction available, Patient being monitored and Timeout performed Patient Re-evaluated:Patient Re-evaluated prior to inductionOxygen Delivery Method: Circle system utilized Preoxygenation: Pre-oxygenation with 100% oxygen Intubation Type: IV induction Ventilation: Mask ventilation without difficulty Laryngoscope Size: Mac and 3 Grade View: Grade I Tube type: Oral Tube size: 8.0 mm Number of attempts: 1 Airway Equipment and Method: Patient positioned with wedge pillow Placement Confirmation: ETT inserted through vocal cords under direct vision Secured at: 23 cm Tube secured with: Tape Dental Injury: Teeth and Oropharynx as per pre-operative assessment

## 2013-05-02 NOTE — Transfer of Care (Signed)
Immediate Anesthesia Transfer of Care Note  Patient: Ralph Garcia  Procedure(s) Performed: Procedure(s): ROBOTIC ASSISTED LAPAROSCOPIC RADICAL PROSTATECTOMY LEVEL 1 (N/A)  Patient Location: PACU  Anesthesia Type:General  Level of Consciousness: awake, alert  and oriented  Airway & Oxygen Therapy: Patient Spontanous Breathing and Patient connected to face mask oxygen  Post-op Assessment: Report given to PACU RN  Post vital signs: Reviewed and stable  Complications: No apparent anesthesia complications

## 2013-05-02 NOTE — Progress Notes (Signed)
Patient ambulated in hallway >200 feet. Patient tolerated well. Incentive spirometer encouraged. Will continue to monitor patient. Setzer, Don Broach

## 2013-05-02 NOTE — Op Note (Signed)
Preoperative diagnosis: Clinically localized adenocarcinoma of the prostate (clinical stage T1c Nx Mx)  Postoperative diagnosis: Clinically localized adenocarcinoma of the prostate (clinical stage T1c Nx Mx)  Procedure:  1. Robotic assisted laparoscopic radical prostatectomy (bilateral nerve sparing)  Surgeon: Rolly Salter, Montez Hageman. M.D.  Assistant: Pecola Leisure, PA-C  Anesthesia: General  Complications: None  EBL: 75 mL  IVF:  1000 mL crystalloid  Specimens: 1. Prostate and seminal vesicles  Disposition of specimens: Pathology  Drains: 1. 20 Fr coude catheter 2. # 19 Blake pelvic drain  Indication: Ralph Garcia is a 47 y.o. year old patient with clinically localized prostate cancer.  After a thorough review of the management options for treatment of prostate cancer, he elected to proceed with surgical therapy and the above procedure(s).  We have discussed the potential benefits and risks of the procedure, side effects of the proposed treatment, the likelihood of the patient achieving the goals of the procedure, and any potential problems that might occur during the procedure or recuperation. Informed consent has been obtained.  Description of procedure:  The patient was taken to the operating room and a general anesthetic was administered. He was given preoperative antibiotics, placed in the dorsal lithotomy position, and prepped and draped in the usual sterile fashion. Next a preoperative timeout was performed. A urethral catheter was placed into the bladder and a site was selected near the umbilicus for placement of the camera port. This was placed using a standard open Hassan technique which allowed entry into the peritoneal cavity under direct vision and without difficulty. A 12 mm port was placed and a pneumoperitoneum established. The camera was then used to inspect the abdomen and there was no evidence of any intra-abdominal injuries or other abnormalities. The remaining  abdominal ports were then placed. 8 mm robotic ports were placed in the right lower quadrant, left lower quadrant, and far left lateral abdominal wall. A 5 mm port was placed in the right upper quadrant and a 12 mm port was placed in the right lateral abdominal wall for laparoscopic assistance. All ports were placed under direct vision without difficulty. The surgical cart was then docked.   Utilizing the cautery scissors, the bladder was reflected posteriorly allowing entry into the space of Retzius and identification of the endopelvic fascia and prostate. The periprostatic fat was then removed from the prostate allowing full exposure of the endopelvic fascia. The endopelvic fascia was then incised from the apex back to the base of the prostate bilaterally and the underlying levator muscle fibers were swept laterally off the prostate thereby isolating the dorsal venous complex. The dorsal vein was then stapled and divided with a 45 mm Flex Echelon stapler. Attention then turned to the bladder neck which was divided anteriorly thereby allowing entry into the bladder and exposure of the urethral catheter. The catheter balloon was deflated and the catheter was brought into the operative field and used to retract the prostate anteriorly. The posterior bladder neck was then examined and was divided allowing further dissection between the bladder and prostate posteriorly until the vasa deferentia and seminal vessels were identified. The vasa deferentia were isolated, divided, and lifted anteriorly. The seminal vesicles were dissected down to their tips with care to control the seminal vascular arterial blood supply. These structures were then lifted anteriorly and the space between Denonvillier's fascia and the anterior rectum was developed with a combination of sharp and blunt dissection. This isolated the vascular pedicles of the prostate.  The lateral prostatic  fascia was then sharply incised allowing release of  the neurovascular bundles bilaterally. The vascular pedicles of the prostate were then ligated with Weck clips between the prostate and neurovascular bundles and divided with sharp cold scissor dissection resulting in neurovascular bundle preservation. The neurovascular bundles were then separated off the apex of the prostate and urethra bilaterally.  The urethra was then sharply transected allowing the prostate specimen to be disarticulated. The pelvis was copiously irrigated and hemostasis was ensured. There was no evidence for rectal injury.  Attention then turned to the urethral anastomosis. A 2-0 Vicryl slip knot was placed between Denonvillier's fascia, the posterior bladder neck, and the posterior urethra to reapproximate these structures. A double-armed 3-0 Monocryl suture was then used to perform a 360 running tension-free anastomosis between the bladder neck and urethra. A new urethral catheter was then placed into the bladder and irrigated. There were no blood clots within the bladder and the anastomosis appeared to be watertight. A #19 Blake drain was then brought through the left lateral 8 mm port site and positioned appropriately within the pelvis. It was secured to the skin with a nylon suture. The surgical cart was then undocked. The right lateral 12 mm port site was closed at the fascial level with a 0 Vicryl suture placed laparoscopically. All remaining ports were then removed under direct vision. The prostate specimen was removed intact within the Endopouch retrieval bag via the periumbilical camera port site. This fascial opening was closed with two running 0 Vicryl sutures. 0.25% Marcaine was then injected into all port sites and all incisions were reapproximated at the skin level with staples. Sterile dressings were applied. The patient appeared to tolerate the procedure well and without complications. The patient was able to be extubated and transferred to the recovery unit in  satisfactory condition.  Moody Bruins MD

## 2013-05-02 NOTE — Anesthesia Postprocedure Evaluation (Signed)
Anesthesia Post Note  Patient: Ralph Garcia  Procedure(s) Performed: Procedure(s) (LRB): ROBOTIC ASSISTED LAPAROSCOPIC RADICAL PROSTATECTOMY LEVEL 1 (N/A)  Anesthesia type: General  Patient location: PACU  Post pain: Pain level controlled  Post assessment: Post-op Vital signs reviewed  Last Vitals:  Filed Vitals:   05/02/13 1040  BP: 133/81  Pulse: 57  Temp: 36.4 C  Resp: 14    Post vital signs: Reviewed  Level of consciousness: sedated  Complications: No apparent anesthesia complications

## 2013-05-03 ENCOUNTER — Encounter (HOSPITAL_COMMUNITY): Payer: Self-pay | Admitting: Urology

## 2013-05-03 LAB — HEMOGLOBIN AND HEMATOCRIT, BLOOD
HCT: 37.3 % — ABNORMAL LOW (ref 39.0–52.0)
Hemoglobin: 12.4 g/dL — ABNORMAL LOW (ref 13.0–17.0)

## 2013-05-03 MED ORDER — BISACODYL 10 MG RE SUPP
10.0000 mg | Freq: Once | RECTAL | Status: AC
  Administered 2013-05-03: 10 mg via RECTAL
  Filled 2013-05-03: qty 1

## 2013-05-03 MED ORDER — HYDROCODONE-ACETAMINOPHEN 5-325 MG PO TABS
1.0000 | ORAL_TABLET | Freq: Four times a day (QID) | ORAL | Status: DC | PRN
  Administered 2013-05-03 (×2): 1 via ORAL
  Filled 2013-05-03: qty 2

## 2013-05-03 NOTE — Discharge Summary (Signed)
.   Date of admission: 05/02/2013  Date of discharge: 05/03/2013  Admission diagnosis: Prostate Cancer  Discharge diagnosis: Prostate Cancer  History and Physical: For full details, please see admission history and physical. Briefly, Ralph Garcia is a 47 y.o. gentleman with localized prostate cancer.  After discussing management/treatment options, he elected to proceed with surgical treatment.  Hospital Course: Ralph Garcia was taken to the operating room on 05/02/2013 and underwent a robotic assisted laparoscopic radical prostatectomy. He tolerated this procedure well and without complications. Postoperatively, he was able to be transferred to a regular hospital room following recovery from anesthesia.  He was able to begin ambulating the night of surgery. He remained hemodynamically stable overnight.  He had excellent urine output with appropriately minimal output from his pelvic drain and his pelvic drain was removed on POD #1.  He was transitioned to oral pain medication, tolerated a clear liquid diet, and had met all discharge criteria and was able to be discharged home later on POD#1.  Laboratory values:  Recent Labs  05/02/13 0940 05/03/13 0425  HGB 14.1 12.4*  HCT 42.1 37.3*    Disposition: Home  Discharge instruction: He was instructed to be ambulatory but to refrain from heavy lifting, strenuous activity, or driving. He was instructed on urethral catheter care.  Discharge medications:     Medication List    TAKE these medications       ciprofloxacin 500 MG tablet  Commonly known as:  CIPRO  Take 1 tablet (500 mg total) by mouth 2 (two) times daily. Start day prior to office visit for foley removal     HYDROcodone-acetaminophen 5-325 MG per tablet  Commonly known as:  NORCO  Take 1-2 tablets by mouth every 6 (six) hours as needed for pain.     levothyroxine 175 MCG tablet  Commonly known as:  SYNTHROID, LEVOTHROID  Take 175 mcg by mouth daily before breakfast.         Followup: He will followup in 1 week for catheter removal and to discuss his surgical pathology results.

## 2013-05-03 NOTE — Progress Notes (Signed)
Patient ambulated in the hall independently tonight around 1945, about 300 feet. Patient tolerated well. Incentive spirometer placed within patient's reach and encouraged use.

## 2013-05-03 NOTE — Progress Notes (Signed)
Patient ID: Ralph Garcia, male   DOB: 08-07-1966, 47 y.o.   MRN: [id number]  1 Day Post-Op Subjective: The patient is doing well.  No nausea or vomiting. Pain is adequately controlled.  Objective: Vital signs in last 24 hours: Temp:  [97.2 F (36.2 C)-99.1 F (37.3 C)] 97.2 F (36.2 C) (06/06 0555) Pulse Rate:  [51-67] 55 (06/06 0555) Resp:  [10-17] 16 (06/06 0555) BP: (92-133)/(48-81) 108/56 mmHg (06/06 0555) SpO2:  [96 %-100 %] 98 % (06/06 0555) Weight:  [99.791 kg (220 lb)] 99.791 kg (220 lb) (06/05 1421)  Intake/Output from previous day: 06/05 0701 - 06/06 0700 In: 3485 [P.O.:360; I.V.:3075; IV Piggyback:50] Out: 3225 [Urine:3035; Drains:115; Blood:75] Intake/Output this shift: Total I/O In: 1020 [P.O.:120; I.V.:900] Out: 2685 [Urine:2650; Drains:35]  Physical Exam:  General: Alert and oriented. CV: RRR Lungs: Clear bilaterally. GI: Soft, Nondistended. Incisions: Dressings intact. Urine: Clear Extremities: Nontender, no erythema, no edema.  Lab Results:  Recent Labs  05/02/13 0940 05/03/13 0425  HGB 14.1 12.4*  HCT 42.1 37.3*      Assessment/Plan: POD# 1 s/p robotic prostatectomy.  1) SL IVF 2) Ambulate, Incentive spirometry 3) Transition to oral pain medication 4) Dulcolax suppository 5) D/C pelvic drain 6) Plan for likely discharge later today   Moody Bruins. MD   LOS: 1 day   Quinterious Walraven,LES 05/03/2013, 6:53 AM

## 2013-10-17 ENCOUNTER — Other Ambulatory Visit: Payer: Self-pay | Admitting: Internal Medicine

## 2013-10-17 DIAGNOSIS — C73 Malignant neoplasm of thyroid gland: Secondary | ICD-10-CM

## 2013-10-18 ENCOUNTER — Ambulatory Visit
Admission: RE | Admit: 2013-10-18 | Discharge: 2013-10-18 | Disposition: A | Discharge status: Home / Self Care | Payer: 59 | Source: Ambulatory Visit | Attending: Internal Medicine | Admitting: Internal Medicine

## 2013-10-18 DIAGNOSIS — C73 Malignant neoplasm of thyroid gland: Secondary | ICD-10-CM

## 2014-03-19 ENCOUNTER — Other Ambulatory Visit: Payer: Self-pay | Admitting: Internal Medicine

## 2014-03-19 DIAGNOSIS — C73 Malignant neoplasm of thyroid gland: Secondary | ICD-10-CM

## 2014-04-07 ENCOUNTER — Ambulatory Visit
Admission: RE | Admit: 2014-04-07 | Discharge: 2014-04-07 | Disposition: A | Discharge status: Home / Self Care | Payer: 59 | Source: Ambulatory Visit | Attending: Internal Medicine | Admitting: Internal Medicine

## 2014-04-07 DIAGNOSIS — C73 Malignant neoplasm of thyroid gland: Secondary | ICD-10-CM

## 2014-05-06 ENCOUNTER — Other Ambulatory Visit (HOSPITAL_COMMUNITY): Payer: Self-pay | Admitting: Urology

## 2014-05-06 ENCOUNTER — Ambulatory Visit (HOSPITAL_COMMUNITY)
Admission: RE | Admit: 2014-05-06 | Discharge: 2014-05-06 | Disposition: A | Discharge status: Home / Self Care | Payer: 59 | Source: Ambulatory Visit | Attending: Urology | Admitting: Urology

## 2014-05-06 DIAGNOSIS — N289 Disorder of kidney and ureter, unspecified: Secondary | ICD-10-CM

## 2014-05-06 DIAGNOSIS — N281 Cyst of kidney, acquired: Secondary | ICD-10-CM | POA: Insufficient documentation

## 2014-05-06 DIAGNOSIS — Z01818 Encounter for other preprocedural examination: Secondary | ICD-10-CM | POA: Insufficient documentation

## 2014-05-07 ENCOUNTER — Other Ambulatory Visit: Payer: Self-pay | Admitting: Urology

## 2014-05-20 ENCOUNTER — Encounter (HOSPITAL_COMMUNITY): Payer: Self-pay | Admitting: Pharmacy Technician

## 2014-05-21 ENCOUNTER — Encounter (HOSPITAL_COMMUNITY): Payer: Self-pay

## 2014-05-21 ENCOUNTER — Encounter (HOSPITAL_COMMUNITY)
Admission: RE | Admit: 2014-05-21 | Discharge: 2014-05-21 | Disposition: A | Discharge status: Home / Self Care | Payer: 59 | Source: Ambulatory Visit | Attending: Urology | Admitting: Urology

## 2014-05-21 ENCOUNTER — Encounter (INDEPENDENT_AMBULATORY_CARE_PROVIDER_SITE_OTHER): Payer: Self-pay

## 2014-05-21 HISTORY — DX: Diverticulitis of intestine, part unspecified, without perforation or abscess without bleeding: K57.92

## 2014-05-21 HISTORY — DX: Other specified disorders of kidney and ureter: N28.89

## 2014-05-21 LAB — BASIC METABOLIC PANEL
BUN: 19 mg/dL (ref 6–23)
CHLORIDE: 103 meq/L (ref 96–112)
CO2: 26 meq/L (ref 19–32)
CREATININE: 0.84 mg/dL (ref 0.50–1.35)
Calcium: 9 mg/dL (ref 8.4–10.5)
GFR calc Af Amer: >90 mL/min (ref >90)
GFR calc non Af Amer: >90 mL/min (ref >90)
Glucose, Bld: 87 mg/dL (ref 70–99)
Potassium: 4.8 mEq/L (ref 3.7–5.3)
Sodium: 141 mEq/L (ref 137–147)

## 2014-05-21 LAB — CBC
HEMATOCRIT: 41.3 % (ref 39.0–52.0)
HEMOGLOBIN: 13.5 g/dL (ref 13.0–17.0)
MCH: 28 pg (ref 26.0–34.0)
MCHC: 32.7 g/dL (ref 30.0–36.0)
MCV: 85.7 fL (ref 78.0–100.0)
Platelets: 272 10*3/uL (ref 150–400)
RBC: 4.82 MIL/uL (ref 4.22–5.81)
RDW: 13.2 % (ref 11.5–15.5)
WBC: 9.6 10*3/uL (ref 4.0–10.5)

## 2014-05-21 NOTE — Patient Instructions (Addendum)
Ralph Garcia  05/21/2014                           YOUR PROCEDURE IS SCHEDULED ON: 05/22/14 AT 11:00AM               ENTER THRU [facility] MAIN HOSPITAL ENTRANCE AND                            FOLLOW  SIGNS TO SHORT STAY CENTER                 ARRIVE AT SHORT STAY AT: 9:00 AM               CALL THIS NUMBER IF ANY PROBLEMS THE DAY OF SURGERY :               [phone]                                REMEMBER:   Do not eat food or drink liquids AFTER MIDNIGHT   May have clear liquids UNTIL 6 HOURS BEFORE SURGERY               Take these medicines the morning of surgery with               A SIPS OF WATER :   LEVOTHYROXINE      Do not wear jewelry, make-up   Do not wear lotions, powders, or perfumes.   Do not shave legs or underarms 12 hrs. before surgery (men may shave face)  Do not bring valuables to the hospital.  Contacts, dentures or bridgework may not be worn into surgery.  Leave suitcase in the car. After surgery it may be brought to your room.  For patients admitted to the hospital more than one night, checkout time is            11:00 AM                                                     ________________________________________________________________________                                                         [place]  Before surgery, you can play an important role.  Because skin is not sterile, your skin needs to be as free of germs as possible.  You can reduce the number of germs on your skin by washing with CHG (chlorahexidine gluconate) soap before surgery.  CHG is an antiseptic cleaner which kills germs and bonds with the skin to continue killing germs even after washing. Please DO NOT use if you have an allergy to CHG or antibacterial soaps.  If your skin becomes reddened/irritated stop using the CHG and inform your nurse when you arrive at Short Stay. Do not shave (including legs and underarms) for at least 48 hours prior  to the first CHG shower.  You may shave your face. Please follow these instructions carefully:  1.  Shower with CHG Soap the night before surgery and the  morning of Surgery.   2.  If you choose to wash your hair, wash your hair first as usual with your  normal  Shampoo.   3.  After you shampoo, rinse your hair and body thoroughly to remove the  shampoo.                                         4.  Use CHG as you would any other liquid soap.  You can apply chg directly  to the skin and wash . Gently wash with scrungie or clean wascloth    5.  Apply the CHG Soap to your body ONLY FROM THE NECK DOWN.   Do not use on open                           Wound or open sores. Avoid contact with eyes, ears mouth and genitals (private parts).                        Genitals (private parts) with your normal soap.              6.  Wash thoroughly, paying special attention to the area where your surgery  will be performed.   7.  Thoroughly rinse your body with warm water from the neck down.   8.  DO NOT shower/wash with your normal soap after using and rinsing off  the CHG Soap .                9.  Pat yourself dry with a clean towel.             10.  Wear clean pajamas.             11.  Place clean sheets on your bed the night of your first shower and do not  sleep with pets.  Day of Surgery : Do not apply any lotions/deodorants the morning of surgery.  Please wear clean clothes to the hospital/surgery center.  FAILURE TO FOLLOW THESE INSTRUCTIONS MAY RESULT IN THE CANCELLATION OF YOUR SURGERY    PATIENT SIGNATURE_________________________________  ______________________________________________________________________     Ralph Garcia  An incentive spirometer is a tool that can help keep your lungs clear and active. This tool measures how well you are filling your lungs with each breath. Taking long deep breaths may help reverse or decrease the chance of developing breathing  (pulmonary) problems (especially infection) following:  A long period of time when you are unable to move or be active. BEFORE THE PROCEDURE   If the spirometer includes an indicator to show your best effort, your nurse or respiratory therapist will set it to a desired goal.  If possible, sit up straight or lean slightly forward. Try not to slouch.  Hold the incentive spirometer in an upright position. INSTRUCTIONS FOR USE  1. Sit on the edge of your bed if possible, or sit up as far as you can in bed or on a chair. 2. Hold the incentive spirometer in an upright position. 3. Breathe out normally. 4. Place the mouthpiece in your mouth and seal your lips tightly around it. 5. Breathe in slowly and as deeply as possible, raising the piston or the ball toward the top  of the column. 6. Hold your breath for 3-5 seconds or for as long as possible. Allow the piston or ball to fall to the bottom of the column. 7. Remove the mouthpiece from your mouth and breathe out normally. 8. Rest for a few seconds and repeat Steps 1 through 7 at least 10 times every 1-2 hours when you are awake. Take your time and take a few normal breaths between deep breaths. 9. The spirometer may include an indicator to show your best effort. Use the indicator as a goal to work toward during each repetition. 10. After each set of 10 deep breaths, practice coughing to be sure your lungs are clear. If you have an incision (the cut made at the time of surgery), support your incision when coughing by placing a pillow or rolled up towels firmly against it. Once you are able to get out of bed, walk around indoors and cough well. You may stop using the incentive spirometer when instructed by your caregiver.  RISKS AND COMPLICATIONS  Take your time so you do not get dizzy or light-headed.  If you are in pain, you may need to take or ask for pain medication before doing incentive spirometry. It is harder to take a deep breath if you  are having pain. AFTER USE  Rest and breathe slowly and easily.  It can be helpful to keep track of a log of your progress. Your caregiver can provide you with a simple table to help with this. If you are using the spirometer at home, follow these instructions: [place] IF:   You are having difficultly using the spirometer.  You have trouble using the spirometer as often as instructed.  Your pain medication is not giving enough relief while using the spirometer.  You develop fever of 100.5 F (38.1 C) or higher. SEEK IMMEDIATE MEDICAL CARE IF:   You cough up bloody sputum that had not been present before.  You develop fever of 102 F (38.9 C) or greater.  You develop worsening pain at or near the incision site. MAKE SURE YOU:   Understand these instructions.  Will watch your condition.  Will get help right away if you are not doing well or get worse. Document Released: 03/27/2007 Document Revised: 02/06/2012 Document Reviewed: 05/28/2007 ExitCare Patient Information 2014 ExitCare, [place].   ________________________________________________________________________  WHAT IS A BLOOD TRANSFUSION? Blood Transfusion Information  A transfusion is the replacement of blood or some of its parts. Blood is made up of multiple cells which provide different functions.  Red blood cells carry oxygen and are used for blood loss replacement.  White blood cells fight against infection.  Platelets control bleeding.  Plasma helps clot blood.  Other blood products are available for specialized needs, such as hemophilia or other clotting disorders. BEFORE THE TRANSFUSION  Who gives blood for transfusions?   Healthy volunteers who are fully evaluated to make sure their blood is safe. This is blood bank blood. Transfusion therapy is the safest it has ever been in the practice of medicine. Before blood is taken from a donor, a complete history is taken to make sure that person has  no history of diseases nor engages in risky social behavior (examples are intravenous drug use or sexual activity with multiple partners). The donor's travel history is screened to minimize risk of transmitting infections, such as malaria. The donated blood is tested for signs of infectious diseases, such as HIV and hepatitis. The blood is then tested to  be sure it is compatible with you in order to minimize the chance of a transfusion reaction. If you or a relative donates blood, this is often done in anticipation of surgery and is not appropriate for emergency situations. It takes many days to process the donated blood. RISKS AND COMPLICATIONS Although transfusion therapy is very safe and saves many lives, the main dangers of transfusion include:   Getting an infectious disease.  Developing a transfusion reaction. This is an allergic reaction to something in the blood you were given. Every precaution is taken to prevent this. The decision to have a blood transfusion has been considered carefully by your caregiver before blood is given. Blood is not given unless the benefits outweigh the risks. AFTER THE TRANSFUSION  Right after receiving a blood transfusion, you will usually feel much better and more energetic. This is especially true if your red blood cells have gotten low (anemic). The transfusion raises the level of the red blood cells which carry oxygen, and this usually causes an energy increase.  The nurse administering the transfusion will monitor you carefully for complications. HOME CARE INSTRUCTIONS  No special instructions are needed after a transfusion. You may find your energy is better. Speak with your caregiver about any limitations on activity for underlying diseases you may have. SEEK MEDICAL CARE IF:   Your condition is not improving after your transfusion.  You develop redness or irritation at the intravenous (IV) site. SEEK IMMEDIATE MEDICAL CARE IF:  Any of the following  symptoms occur over the next 12 hours:  Shaking chills.  You have a temperature by mouth above 102 F (38.9 C), not controlled by medicine.  Chest, back, or muscle pain.  People around you feel you are not acting correctly or are confused.  Shortness of breath or difficulty breathing.  Dizziness and fainting.  You get a rash or develop hives.  You have a decrease in urine output.  Your urine turns a dark color or changes to pink, red, or brown. Any of the following symptoms occur over the next 10 days:  You have a temperature by mouth above 102 F (38.9 C), not controlled by medicine.  Shortness of breath.  Weakness after normal activity.  The white part of the eye turns yellow (jaundice).  You have a decrease in the amount of urine or are urinating less often.  Your urine turns a dark color or changes to pink, red, or brown. Document Released: 11/11/2000 Document Revised: 02/06/2012 Document Reviewed: 06/30/2008 ExitCare Patient Information 2014 [place].  _______________________________________________________________________   CLEAR LIQUID DIET   Foods Allowed                                                                     Foods Excluded  Coffee and tea, regular and decaf                             liquids that you cannot  Plain Jell-O in any flavor  see through such as: Fruit ices (not with fruit pulp)                                     milk, soups, orange juice  Iced Popsicles                                    All solid food Carbonated beverages, regular and diet                                    Cranberry, grape and apple juices Sports drinks like Gatorade Lightly seasoned clear broth or consume(fat free) Sugar, honey syrup  Sample Menu Breakfast                                Lunch                                     Supper Cranberry juice                    Beef broth                             Chicken broth Jell-O                                     Grape juice                           Apple juice Coffee or tea                        Jell-O                                      Popsicle                                                Coffee or tea                        Coffee or tea  _____________________________________________________________________

## 2014-05-21 NOTE — H&P (Signed)
History of Present Illness Ralph Garcia is a 48 year old with the following urologic history:    1) Prostate cancer: He is s/p a BNS RAL radical prostatectomy on 05/03/13. His PSA has been undetectable since surgery.    Diagnosis: pT2c Nx Mx, Gleason 3+3=6 adenocarcinoma with negative surgical margins  Pretreatment PSA: 4.57  Pretreatment SHIM: 21    2) Right renal mass: He underwent a CT scan for lower abdominal pain in May 2015 and was found to incidentally have two hyperdense lesions of the right kidney.    Interval history:    Ralph Garcia follows up today after undergoing a renal mass protocol CT scan for follow-up of the incidental right renal lesion noted during a CT scan with contrast of the abdomen in early May 2015. He currently is completely asymptomatic and denies any further abdominal pain symptoms. He also denies any hematuria, unintentional weight loss, night sweats, or other complaints.     Past Medical History Problems  1. History of Thyroid Cancer (V10.87)  Surgical History Problems  1. History of Prostatect Retropubic Radical W/ Nerve Sparing Laparoscopic 2. History of Thyroid Surgery Total Thyroidectomy 3. History of Tonsillectomy  Current Meds 1. Cialis 20 MG Oral Tablet; TAKE 1 TABLET As Directed;  Therapy: [id number] to (Last Rx:16Jul2014)  Requested for: [id number] Ordered 2. Levothyroxine Sodium 175 MCG Oral Tablet;  Therapy: 20Nov2013 to Recorded 3. MetroNIDAZOLE 500 MG Oral Tablet;  Therapy: [id number] to Recorded  Allergies Medication  1. Erythromycin GEL 2. Neomycin-Polymyxin-Bacitracin OINT  Family History Problems  1. Family history of Breast Cancer (V16.3) : Mother 2. Family history of Multiple Myeloma : Father  Social History Problems  1. Denied: History of Alcohol Use 2. Marital History - Currently Married 3. Never A Smoker  Review of Systems  Genitourinary: no hematuria.  Gastrointestinal: no nausea and no vomiting.   Constitutional: no recent weight loss.  Cardiovascular: no leg swelling.    Vitals Vital Signs [Data Includes: Last 1 Day]  Recorded: [id number] 07:55AM  Height: 6 ft 3 in Weight: 215 lb  BMI Calculated: 26.87 BSA Calculated: 2.26 Blood Pressure: 116 / 71 Temperature: 97.6 F Heart Rate: 76  Physical Exam Constitutional: Well nourished and well developed . No acute distress.  ENT:. The ears and nose are normal in appearance.  Neck: The appearance of the neck is normal and no neck mass is present.  Pulmonary: No respiratory distress, normal respiratory rhythm and effort and clear bilateral breath sounds.  Cardiovascular: Heart rate and rhythm are normal . No peripheral edema.  Abdomen: Incision site(s) well healed. The abdomen is soft and nontender. No masses are palpated. No CVA tenderness. No hernias are palpable. No hepatosplenomegaly noted.  Lymphatics: The supraclavicular, femoral and inguinal nodes are not enlarged or tender.  Skin: Normal skin turgor, no visible rash and no visible skin lesions.  Neuro/Psych:. Mood and affect are appropriate.    Results/Data Urine [Data Includes: Last 1 Day]   [id number]  COLOR YELLOW   APPEARANCE CLEAR   SPECIFIC GRAVITY >1.030   pH 5.5   GLUCOSE NEG mg/dL  BILIRUBIN NEG   KETONE NEG mg/dL  BLOOD NEG   PROTEIN NEG mg/dL  UROBILINOGEN 0.2 mg/dL  NITRITE NEG   LEUKOCYTE ESTERASE NEG   Selected Results  CT-ABD W/W/O CONTRAST [id number] 12:00AM Raynelle Bring   Test Name Result Flag Reference  CT-ABD W/W/O CONTRAST (Report)    ** RADIOLOGY REPORT BY [facility] **   CLINICAL DATA: Evaluate for renal  mass  EXAM: CT ABDOMEN WITHOUT AND WITH CONTRAST  TECHNIQUE: Multidetector CT imaging of the abdomen was performed following the standard protocol before and following the bolus administration of intravenous contrast.  CONTRAST: 125 cc of Isovue 300  COMPARISON: 03/31/2014  FINDINGS: The lung bases are clear. No  pleural or pericardial effusion. There is no liver abnormality identified. Small stone within the gallbladder fundus is identified. There is no biliary dilatation. Normal appearance of the pancreas. The spleen is unremarkable. Normal appearance of the adrenal glands.Enhancing, partially exophytic cyst arising from the anterior, lower pole of right kidney measures 2.1 cm, image 48/series 3. Nonenhancing cyst arising from the midpole of the right kidney measures 1.4 cm, image 41/series 3. Normal appearance of the left kidney.  Normal caliber of the abdominal aorta. There is no adenopathy within the abdomen. No free fluid or fluid collections identified. Left lower quadrant inflammatory changes are again noted compatible with  IMPRESSION: 1. Bosniak category 3 cyst arises from the anterior aspect of the lower pole of right kidney. 2. Other cyst is Bosniak category 1 3. Sigmoid diverticulitis.   Electronically Signed  By: Kerby Moors M.D.  On: 04/17/2014 16:39   COMPREHENSIVE METABOLIC PANEL [id number] 14:97WY Raynelle Bring  SPECIMEN TYPE: BLOOD   Test Name Result Flag Reference  GLUCOSE 83 mg/dL  70-99  BUN 21 mg/dL  6-23  CREATININE 0.84 mg/dL  0.50-1.50  SODIUM 139 mEq/L  135-145  POTASSIUM 4.2 mEq/L  3.5-5.3  CHLORIDE 104 mEq/L  96-112  CO2 24 mEq/L  19-32  CALCIUM 8.8 mg/dL  8.4-10.5  TOTAL PROTEIN 6.5 g/dL  6.0-8.3  ALBUMIN 3.7 g/dL  3.5-5.2  AST/SGOT 24 U/L  0-37  ALT/SGPT 23 U/L  0-53  ALKALINE PHOSPHATASE 48 U/L  39-117  BILIRUBIN, TOTAL 0.3 mg/dL  0.2-1.2  Est GFR, African American >89 mL/min    Est GFR, NonAfrican American >89 mL/min    THE ESTIMATED GFR IS A CALCULATION VALID FOR ADULTS (>=53 YEARS OLD) THAT USES THE CKD-EPI ALGORITHM TO ADJUST FOR AGE AND SEX. IT IS   NOT TO BE USED FOR CHILDREN, PREGNANT WOMEN, HOSPITALIZED PATIENTS,    PATIENTS ON DIALYSIS, OR WITH RAPIDLY CHANGING KIDNEY FUNCTION. ACCORDING TO THE NKDEP, EGFR >89 IS NORMAL, 60-89 SHOWS  MILD IMPAIRMENT, 30-59 SHOWS MODERATE IMPAIRMENT, 15-29 SHOWS SEVERE IMPAIRMENT AND <15 IS ESRD.    I independently reviewed the CT scan of the abdomen from 04/17/14. This does demonstrate 2 lesions on the right kidney as previously noted. The mass toward the lateral interpolar region of the kidney appears to be consistent with a simple cyst without concern for enhancement. The lower pole anterior mass measures 2.1 cm in largest diameter. This is exophytic and does appear to demonstrate enhancement in some areas consistent with a Bosniak 3 cyst. There is no regional lymphadenopathy, contralateral renal masses, adrenal abnormalities, or metastatic disease to the abdomen. There appears to be a single right renal artery. There is no evidence of renal vein or inferior vena caval involvement.  Assessment Assessed  1. Renal mass (593.9)  Plan Health Maintenance  1. UA With REFLEX; [Do Not Release]; Status:Complete;   Done: [id number] 07:48AM Renal mass  2. CHEST X-RAY; Status:Hold For - Records; Requested YDX:[id number];  3. Follow-up Office  Follow-up - will call to schedule surgery.  Status: Complete  Done:  [id number]  Discussion/Summary 1. Right renal mass concerning for possible malignancy: We discussed his imaging findings today and the fact that he has appear to have a Bosniak  3 right renal cyst. I explained that this would carry an approximately 50% risk of malignancy and that surgical removal was the only way to make a definitive diagnosis. Considering his young age and excellent overall health, I did recommend surgical resection with a minimally invasive partial nephrectomy.   The patient was provided information regarding their renal mass including the relative risk of benign versus malignant pathology and the natural history of renal cell carcinoma and other possible malignancies of the kidney. The role of renal biopsy, laboratory testing, and imaging studies to further characterize renal  masses and/or the presence of metastatic disease were explained. We discussed the role of active surveillance, surgical therapy with both radical nephrectomy and nephron-sparing surgery, and ablative therapy in the treatment of renal masses. In addition, we discussed our goals of providing an accurate diagnosis and oncologic control while maintaining optimal renal function as appropriate based on the size, location, and complexity of their renal mass as well as their co-morbidities.    We have discussed the risks of treatment in detail including but not limited to bleeding, infection, heart attack, stroke, death, venothromoboembolism, cancer recurrence, injury/damage to surrounding organs and structures, urine leak, the possibility of open surgical conversion for patients undergoing minimally invasive surgery, the risk of developing chronic kidney disease and its associated implications, and the potential risk of end stage renal disease possibly necessitating dialysis.     He is agreeable to proceed with surgical resection of his right renal mass. He will have chest imaging performed today to exclude the possibility of metastatic disease to the lungs. He will be tentatively scheduled for a right robotic-assisted laparoscopic partial nephrectomy in the near future.    2. Prostate cancer: He will be due for surveillance follow-up around November or December 2015.    3. Erectile dysfunction: He will continue to utilize his vacuum erection device for penile rehabilitation.    Cc: Dr. Nelda Bucks  A total of 50 minutes were spent in the overall care of the patient today1  with 42 minutes in direct face to face consultation.1      1 Amended By: Raynelle Bring; May 06 2014 8:47 AM EST  Signatures Electronically signed by : Raynelle Bring, M.D.; May 06 2014  8:47AM EST

## 2014-05-22 ENCOUNTER — Inpatient Hospital Stay (HOSPITAL_COMMUNITY)
Admission: RE | Admit: 2014-05-22 | Discharge: 2014-05-24 | DRG: 657 | Disposition: A | Discharge status: Home / Self Care | Payer: 59 | Source: Ambulatory Visit | Attending: Urology | Admitting: Urology

## 2014-05-22 ENCOUNTER — Encounter (HOSPITAL_COMMUNITY): Payer: Self-pay | Admitting: *Deleted

## 2014-05-22 ENCOUNTER — Encounter (HOSPITAL_COMMUNITY)
Admission: RE | Disposition: A | Discharge status: Home / Self Care | Payer: Self-pay | Source: Ambulatory Visit | Attending: Urology

## 2014-05-22 ENCOUNTER — Encounter (HOSPITAL_COMMUNITY): Payer: 59 | Admitting: Anesthesiology

## 2014-05-22 ENCOUNTER — Ambulatory Visit (HOSPITAL_COMMUNITY): Payer: 59 | Admitting: Anesthesiology

## 2014-05-22 DIAGNOSIS — Z880 Allergy status to penicillin: Secondary | ICD-10-CM

## 2014-05-22 DIAGNOSIS — Z8546 Personal history of malignant neoplasm of prostate: Secondary | ICD-10-CM

## 2014-05-22 DIAGNOSIS — Z881 Allergy status to other antibiotic agents status: Secondary | ICD-10-CM

## 2014-05-22 DIAGNOSIS — Z01812 Encounter for preprocedural laboratory examination: Secondary | ICD-10-CM

## 2014-05-22 DIAGNOSIS — Q619 Cystic kidney disease, unspecified: Secondary | ICD-10-CM

## 2014-05-22 DIAGNOSIS — D49519 Neoplasm of unspecified behavior of unspecified kidney: Secondary | ICD-10-CM | POA: Diagnosis present

## 2014-05-22 DIAGNOSIS — Z79899 Other long term (current) drug therapy: Secondary | ICD-10-CM

## 2014-05-22 DIAGNOSIS — Z85850 Personal history of malignant neoplasm of thyroid: Secondary | ICD-10-CM

## 2014-05-22 DIAGNOSIS — C649 Malignant neoplasm of unspecified kidney, except renal pelvis: Principal | ICD-10-CM | POA: Diagnosis present

## 2014-05-22 DIAGNOSIS — Z01810 Encounter for preprocedural cardiovascular examination: Secondary | ICD-10-CM

## 2014-05-22 DIAGNOSIS — R112 Nausea with vomiting, unspecified: Secondary | ICD-10-CM | POA: Diagnosis not present

## 2014-05-22 DIAGNOSIS — Z803 Family history of malignant neoplasm of breast: Secondary | ICD-10-CM

## 2014-05-22 HISTORY — PX: ROBOTIC ASSITED PARTIAL NEPHRECTOMY: SHX6087

## 2014-05-22 LAB — BASIC METABOLIC PANEL
BUN: 17 mg/dL (ref 6–23)
CALCIUM: 8.3 mg/dL — AB (ref 8.4–10.5)
CO2: 26 mEq/L (ref 19–32)
CREATININE: 0.95 mg/dL (ref 0.50–1.35)
Chloride: 101 mEq/L (ref 96–112)
Glucose, Bld: 101 mg/dL — ABNORMAL HIGH (ref 70–99)
POTASSIUM: 4 meq/L (ref 3.7–5.3)
Sodium: 138 mEq/L (ref 137–147)

## 2014-05-22 LAB — HEMOGLOBIN AND HEMATOCRIT, BLOOD
HCT: 40.9 % (ref 39.0–52.0)
Hemoglobin: 13.5 g/dL (ref 13.0–17.0)

## 2014-05-22 LAB — TYPE AND SCREEN
ABO/RH(D): A POS
Antibody Screen: NEGATIVE

## 2014-05-22 SURGERY — ROBOTIC ASSITED PARTIAL NEPHRECTOMY
Anesthesia: General | Laterality: Right

## 2014-05-22 MED ORDER — PHENYLEPHRINE HCL 10 MG/ML IJ SOLN
INTRAMUSCULAR | Status: DC | PRN
  Administered 2014-05-22 (×4): 40 ug via INTRAVENOUS

## 2014-05-22 MED ORDER — GLYCOPYRROLATE 0.2 MG/ML IJ SOLN
INTRAMUSCULAR | Status: AC
  Filled 2014-05-22: qty 4

## 2014-05-22 MED ORDER — ONDANSETRON HCL 4 MG/2ML IJ SOLN
INTRAMUSCULAR | Status: AC
  Filled 2014-05-22: qty 2

## 2014-05-22 MED ORDER — LACTATED RINGERS IR SOLN
Status: DC | PRN
  Administered 2014-05-22: 1000 mL

## 2014-05-22 MED ORDER — SODIUM CHLORIDE 0.9 % IJ SOLN
INTRAMUSCULAR | Status: AC
  Filled 2014-05-22: qty 20

## 2014-05-22 MED ORDER — STERILE WATER FOR IRRIGATION IR SOLN
Status: DC | PRN
  Administered 2014-05-22: 1500 mL

## 2014-05-22 MED ORDER — DIPHENHYDRAMINE HCL 12.5 MG/5ML PO ELIX: 12.5000 mg | ORAL_SOLUTION | Freq: Four times a day (QID) | ORAL | Status: DC | PRN

## 2014-05-22 MED ORDER — DEXTROSE-NACL 5-0.45 % IV SOLN
INTRAVENOUS | Status: DC
  Administered 2014-05-22 – 2014-05-23 (×4): via INTRAVENOUS

## 2014-05-22 MED ORDER — MIDAZOLAM HCL 5 MG/5ML IJ SOLN
INTRAMUSCULAR | Status: DC | PRN
  Administered 2014-05-22: 2 mg via INTRAVENOUS

## 2014-05-22 MED ORDER — CEFAZOLIN SODIUM-DEXTROSE 2-3 GM-% IV SOLR
2.0000 g | INTRAVENOUS | Status: AC
  Administered 2014-05-22: 2 g via INTRAVENOUS

## 2014-05-22 MED ORDER — HYDROCODONE-ACETAMINOPHEN 5-325 MG PO TABS: 1.0000 | ORAL_TABLET | Freq: Four times a day (QID) | ORAL | Status: AC | PRN

## 2014-05-22 MED ORDER — BUPIVACAINE LIPOSOME 1.3 % IJ SUSP
20.0000 mL | Freq: Once | INTRAMUSCULAR | Status: DC
  Filled 2014-05-22: qty 20

## 2014-05-22 MED ORDER — GLYCOPYRROLATE 0.2 MG/ML IJ SOLN
INTRAMUSCULAR | Status: DC | PRN
  Administered 2014-05-22: .8 mg via INTRAVENOUS

## 2014-05-22 MED ORDER — LEVOTHYROXINE SODIUM 175 MCG PO TABS
175.0000 ug | ORAL_TABLET | Freq: Every day | ORAL | Status: DC
  Administered 2014-05-23 – 2014-05-24 (×2): 175 ug via ORAL
  Filled 2014-05-22 (×3): qty 1

## 2014-05-22 MED ORDER — FENTANYL CITRATE 0.05 MG/ML IJ SOLN
INTRAMUSCULAR | Status: DC | PRN
  Administered 2014-05-22 (×2): 50 ug via INTRAVENOUS
  Administered 2014-05-22: 100 ug via INTRAVENOUS
  Administered 2014-05-22: 50 ug via INTRAVENOUS

## 2014-05-22 MED ORDER — MEPERIDINE HCL 50 MG/ML IJ SOLN: 6.2500 mg | INTRAMUSCULAR | Status: DC | PRN

## 2014-05-22 MED ORDER — ROCURONIUM BROMIDE 100 MG/10ML IV SOLN
INTRAVENOUS | Status: AC
Start: 2014-05-22 — End: 2014-05-22
  Filled 2014-05-22: qty 1

## 2014-05-22 MED ORDER — OXYCODONE HCL 5 MG PO TABS
5.0000 mg | ORAL_TABLET | ORAL | Status: DC | PRN
  Administered 2014-05-22 – 2014-05-23 (×3): 5 mg via ORAL
  Filled 2014-05-22 (×3): qty 1

## 2014-05-22 MED ORDER — EPHEDRINE SULFATE 50 MG/ML IJ SOLN
INTRAMUSCULAR | Status: AC
  Filled 2014-05-22: qty 1

## 2014-05-22 MED ORDER — BUPIVACAINE LIPOSOME 1.3 % IJ SUSP
INTRAMUSCULAR | Status: DC | PRN
  Administered 2014-05-22: 20 mL

## 2014-05-22 MED ORDER — HYDROMORPHONE HCL PF 2 MG/ML IJ SOLN
INTRAMUSCULAR | Status: AC
  Filled 2014-05-22: qty 1

## 2014-05-22 MED ORDER — MORPHINE SULFATE 2 MG/ML IJ SOLN
2.0000 mg | INTRAMUSCULAR | Status: DC | PRN
  Administered 2014-05-22: 2 mg via INTRAVENOUS
  Filled 2014-05-22: qty 1

## 2014-05-22 MED ORDER — CEFAZOLIN SODIUM-DEXTROSE 2-3 GM-% IV SOLR
INTRAVENOUS | Status: AC
  Filled 2014-05-22: qty 50

## 2014-05-22 MED ORDER — LIDOCAINE HCL (CARDIAC) 20 MG/ML IV SOLN
INTRAVENOUS | Status: AC
  Filled 2014-05-22: qty 5

## 2014-05-22 MED ORDER — ROCURONIUM BROMIDE 100 MG/10ML IV SOLN
INTRAVENOUS | Status: DC | PRN
  Administered 2014-05-22 (×2): 10 mg via INTRAVENOUS
  Administered 2014-05-22: 50 mg via INTRAVENOUS
  Administered 2014-05-22: 20 mg via INTRAVENOUS

## 2014-05-22 MED ORDER — CEFAZOLIN SODIUM 1-5 GM-% IV SOLN
1.0000 g | Freq: Three times a day (TID) | INTRAVENOUS | Status: AC
  Administered 2014-05-22 – 2014-05-23 (×2): 1 g via INTRAVENOUS
  Filled 2014-05-22 (×2): qty 50

## 2014-05-22 MED ORDER — LACTATED RINGERS IV SOLN
INTRAVENOUS | Status: DC
  Administered 2014-05-22: 14:00:00 via INTRAVENOUS
  Administered 2014-05-22: 1000 mL via INTRAVENOUS

## 2014-05-22 MED ORDER — DOCUSATE SODIUM 100 MG PO CAPS
100.0000 mg | ORAL_CAPSULE | Freq: Two times a day (BID) | ORAL | Status: DC
  Administered 2014-05-22 – 2014-05-23 (×3): 100 mg via ORAL
  Filled 2014-05-22 (×5): qty 1

## 2014-05-22 MED ORDER — ONDANSETRON HCL 4 MG/2ML IJ SOLN
INTRAMUSCULAR | Status: DC | PRN
Start: 2014-05-22 — End: 2014-05-22
  Administered 2014-05-22: 4 mg via INTRAVENOUS

## 2014-05-22 MED ORDER — HYDROMORPHONE HCL PF 1 MG/ML IJ SOLN
INTRAMUSCULAR | Status: DC | PRN
  Administered 2014-05-22: .6 mg via INTRAVENOUS
  Administered 2014-05-22: 1 mg via INTRAVENOUS
  Administered 2014-05-22: .4 mg via INTRAVENOUS

## 2014-05-22 MED ORDER — FENTANYL CITRATE 0.05 MG/ML IJ SOLN
INTRAMUSCULAR | Status: AC
  Filled 2014-05-22: qty 5

## 2014-05-22 MED ORDER — EPHEDRINE SULFATE 50 MG/ML IJ SOLN
INTRAMUSCULAR | Status: DC | PRN
  Administered 2014-05-22: 10 mg via INTRAVENOUS

## 2014-05-22 MED ORDER — LACTATED RINGERS IV SOLN: INTRAVENOUS | Status: DC

## 2014-05-22 MED ORDER — ONDANSETRON HCL 4 MG/2ML IJ SOLN
4.0000 mg | INTRAMUSCULAR | Status: DC | PRN
  Administered 2014-05-23: 4 mg via INTRAVENOUS
  Filled 2014-05-22: qty 2

## 2014-05-22 MED ORDER — MANNITOL 25 % IV SOLN
25.0000 g | Freq: Once | INTRAVENOUS | Status: AC
  Administered 2014-05-22 (×2): 12.5 g via INTRAVENOUS
  Filled 2014-05-22: qty 100

## 2014-05-22 MED ORDER — SODIUM CHLORIDE 0.9 % IJ SOLN
INTRAMUSCULAR | Status: AC
Start: 2014-05-22 — End: 2014-05-22
  Filled 2014-05-22: qty 10

## 2014-05-22 MED ORDER — NEOSTIGMINE METHYLSULFATE 10 MG/10ML IV SOLN
INTRAVENOUS | Status: DC | PRN
  Administered 2014-05-22: 5 mg via INTRAVENOUS

## 2014-05-22 MED ORDER — DIPHENHYDRAMINE HCL 50 MG/ML IJ SOLN: 12.5000 mg | Freq: Four times a day (QID) | INTRAMUSCULAR | Status: DC | PRN

## 2014-05-22 MED ORDER — MIDAZOLAM HCL 2 MG/2ML IJ SOLN
INTRAMUSCULAR | Status: AC
  Filled 2014-05-22: qty 2

## 2014-05-22 MED ORDER — LIDOCAINE HCL (CARDIAC) 20 MG/ML IV SOLN
INTRAVENOUS | Status: DC | PRN
  Administered 2014-05-22: 50 mg via INTRAVENOUS

## 2014-05-22 MED ORDER — ACETAMINOPHEN 10 MG/ML IV SOLN
1000.0000 mg | Freq: Four times a day (QID) | INTRAVENOUS | Status: DC
  Administered 2014-05-22 – 2014-05-23 (×3): 1000 mg via INTRAVENOUS
  Filled 2014-05-22 (×4): qty 100

## 2014-05-22 MED ORDER — PROPOFOL 10 MG/ML IV BOLUS
INTRAVENOUS | Status: AC
  Filled 2014-05-22: qty 20

## 2014-05-22 MED ORDER — HYDROMORPHONE HCL PF 1 MG/ML IJ SOLN: 0.2500 mg | INTRAMUSCULAR | Status: DC | PRN

## 2014-05-22 MED ORDER — PROMETHAZINE HCL 25 MG/ML IJ SOLN: 6.2500 mg | INTRAMUSCULAR | Status: DC | PRN

## 2014-05-22 SURGICAL SUPPLY — 52 items
APPLICATOR SURGIFLO ENDO (HEMOSTASIS) IMPLANT
CHLORAPREP W/TINT 26ML (MISCELLANEOUS) ×2 IMPLANT
CLIP LIGATING HEM O LOK PURPLE (MISCELLANEOUS) ×2 IMPLANT
CLIP LIGATING HEMO O LOK GREEN (MISCELLANEOUS) ×4 IMPLANT
CORDS BIPOLAR (ELECTRODE) ×2 IMPLANT
COVER SURGICAL LIGHT HANDLE (MISCELLANEOUS) ×2 IMPLANT
COVER TIP SHEARS 8 DVNC (MISCELLANEOUS) ×1 IMPLANT
COVER TIP SHEARS 8MM DA VINCI (MISCELLANEOUS) ×1
DECANTER SPIKE VIAL GLASS SM (MISCELLANEOUS) ×2 IMPLANT
DERMABOND ADVANCED (GAUZE/BANDAGES/DRESSINGS) ×2
DERMABOND ADVANCED .7 DNX12 (GAUZE/BANDAGES/DRESSINGS) ×2 IMPLANT
DRAIN CHANNEL 15F RND FF 3/16 (WOUND CARE) ×2 IMPLANT
DRAPE INCISE IOBAN 66X45 STRL (DRAPES) ×2 IMPLANT
DRAPE LAPAROSCOPIC ABDOMINAL (DRAPES) ×2 IMPLANT
DRAPE LG THREE QUARTER DISP (DRAPES) ×4 IMPLANT
DRAPE TABLE BACK 44X90 PK DISP (DRAPES) ×2 IMPLANT
DRAPE WARM FLUID 44X44 (DRAPE) ×2 IMPLANT
ELECT REM PT RETURN 9FT ADLT (ELECTROSURGICAL) ×2
ELECTRODE REM PT RTRN 9FT ADLT (ELECTROSURGICAL) ×1 IMPLANT
EVACUATOR SILICONE 100CC (DRAIN) ×2 IMPLANT
GLOVE BIO SURGEON STRL SZ 6.5 (GLOVE) ×2 IMPLANT
GLOVE BIOGEL M STRL SZ7.5 (GLOVE) ×4 IMPLANT
GOWN STRL REUS W/ TWL LRG LVL3 (GOWN DISPOSABLE) ×2 IMPLANT
GOWN STRL REUS W/TWL LRG LVL3 (GOWN DISPOSABLE) ×4 IMPLANT
HEMOSTAT SURGICEL 4X8 (HEMOSTASIS) IMPLANT
KIT ACCESSORY DA VINCI DISP (KITS) ×1
KIT ACCESSORY DVNC DISP (KITS) ×1 IMPLANT
KIT BASIN OR (CUSTOM PROCEDURE TRAY) ×2 IMPLANT
PENCIL BUTTON HOLSTER BLD 10FT (ELECTRODE) ×2 IMPLANT
POSITIONER SURGICAL ARM (MISCELLANEOUS) ×4 IMPLANT
POUCH SPECIMEN RETRIEVAL 10MM (ENDOMECHANICALS) ×2 IMPLANT
SET TUBE IRRIG SUCTION NO TIP (IRRIGATION / IRRIGATOR) ×2 IMPLANT
SOLUTION ANTI FOG 6CC (MISCELLANEOUS) ×2 IMPLANT
SOLUTION ELECTROLUBE (MISCELLANEOUS) ×2 IMPLANT
SPONGE LAP 18X18 X RAY DECT (DISPOSABLE) IMPLANT
SURGIFLO W/THROMBIN 8M KIT (HEMOSTASIS) ×2 IMPLANT
SUT ETHILON 3 0 PS 1 (SUTURE) ×4 IMPLANT
SUT MNCRL AB 4-0 PS2 18 (SUTURE) ×4 IMPLANT
SUT V-LOC BARB 180 2/0GR6 GS22 (SUTURE) ×2
SUT VIC AB 0 CT1 27 (SUTURE) ×1
SUT VIC AB 0 CT1 27XBRD ANTBC (SUTURE) ×1 IMPLANT
SUT VICRYL 0 UR6 27IN ABS (SUTURE) ×4 IMPLANT
SUT VLOC BARB 180 ABS3/0GR12 (SUTURE) ×2
SUTURE V-LC BRB 180 2/0GR6GS22 (SUTURE) ×1 IMPLANT
SUTURE VLOC BRB 180 ABS3/0GR12 (SUTURE) ×1 IMPLANT
TOWEL OR 17X26 10 PK STRL BLUE (TOWEL DISPOSABLE) ×4 IMPLANT
TOWEL OR NON WOVEN STRL DISP B (DISPOSABLE) ×2 IMPLANT
TRAY FOLEY CATH 16FRSI W/METER (SET/KITS/TRAYS/PACK) ×2 IMPLANT
TRAY LAP CHOLE (CUSTOM PROCEDURE TRAY) ×2 IMPLANT
TROCAR XCEL 12X100 BLDLESS (ENDOMECHANICALS) ×2 IMPLANT
TUBING INSUFFLATION 10FT LAP (TUBING) ×2 IMPLANT
WATER STERILE IRR 1500ML POUR (IV SOLUTION) ×4 IMPLANT

## 2014-05-22 NOTE — Anesthesia Postprocedure Evaluation (Signed)
  Anesthesia Post-op Note  Patient: Ralph Garcia  Procedure(s) Performed: Procedure(s) (LRB): ROBOTIC ASSITED PARTIAL NEPHRECTOMY (Right)  Patient Location: PACU  Anesthesia Type: General  Level of Consciousness: awake and alert   Airway and Oxygen Therapy: Patient Spontanous Breathing  Post-op Pain: mild  Post-op Assessment: Post-op Vital signs reviewed, Patient's Cardiovascular Status Stable, Respiratory Function Stable, Patent Airway and No signs of Nausea or vomiting  Last Vitals:  Filed Vitals:   05/22/14 1530  BP: 136/75  Pulse: 57  Temp: 36.4 C  Resp: 12    Post-op Vital Signs: stable   Complications: No apparent anesthesia complications

## 2014-05-22 NOTE — Progress Notes (Signed)
Patient ID: Ralph Garcia, male   DOB: 04-30-1966, 48 y.o.   MRN: [id number]  Post-op note  Subjective: The patient is doing well.  No complaints.  Objective: Vital signs in last 24 hours: Temp:  [97.3 F (36.3 C)-97.8 F (36.6 C)] 97.5 F (36.4 C) (06/25 1530) Pulse Rate:  [57-73] 57 (06/25 1530) Resp:  [7-18] 12 (06/25 1530) BP: (129-151)/(71-82) 136/75 mmHg (06/25 1530) SpO2:  [99 %-100 %] 99 % (06/25 1530) Weight:  [93.1 kg (205 lb 4 oz)] 93.1 kg (205 lb 4 oz) (06/25 1530)  Intake/Output from previous day:   Intake/Output this shift: Total I/O In: 1500 [I.V.:1500] Out: 270 [Urine:200; Drains:20; Blood:50]  Physical Exam:  General: Alert and oriented. Abdomen: Soft, Nondistended. Incisions: Clean and dry.  Lab Results:  Recent Labs  05/21/14 0940 05/22/14 1448  HGB 13.5 13.5  HCT 41.3 40.9    Assessment/Plan: POD#0   1) Continue to monitor   Pryor Curia. MD   LOS: 0 days   BORDEN,LES 05/22/2014, 6:44 PM

## 2014-05-22 NOTE — Anesthesia Preprocedure Evaluation (Signed)
Anesthesia Evaluation  Patient identified by MRN, date of birth, ID band Patient awake    Reviewed: Allergy & Precautions, H&P , NPO status , Patient's Chart, lab work & pertinent test results  Airway Mallampati: II TM Distance: >3 FB Neck ROM: Full    Dental  (+) Teeth Intact, Dental Advisory Given   Pulmonary neg pulmonary ROS,  breath sounds clear to auscultation  Pulmonary exam normal       Cardiovascular negative cardio ROS  Rhythm:Regular Rate:Normal     Neuro/Psych negative neurological ROS  negative psych ROS   GI/Hepatic negative GI ROS, Neg liver ROS,   Endo/Other  Hypothyroidism Thyroid CA  Renal/GU negative Renal ROS  negative genitourinary   Musculoskeletal negative musculoskeletal ROS (+)   Abdominal   Peds  Hematology negative hematology ROS (+)   Anesthesia Other Findings   Reproductive/Obstetrics                           Anesthesia Physical  Anesthesia Plan  ASA: III  Anesthesia Plan: General   Post-op Pain Management:    Induction: Intravenous  Airway Management Planned: Oral ETT  Additional Equipment:   Intra-op Plan:   Post-operative Plan: Extubation in OR  Informed Consent: I have reviewed the patients History and Physical, chart, labs and discussed the procedure including the risks, benefits and alternatives for the proposed anesthesia with the patient or authorized representative who has indicated his/her understanding and acceptance.   Dental advisory given  Plan Discussed with: CRNA  Anesthesia Plan Comments:         Anesthesia Quick Evaluation

## 2014-05-22 NOTE — Op Note (Signed)
Preoperative diagnosis: Right renal mass  Postoperative diagnosis: Right renal mass  Procedure:  1. Right robotic-assisted laparoscopic partial nephrectomy  Surgeon: Pryor Curia. M.D.  Assistant(s):  Leta Baptist, PA-C  Anesthesia: General  Complications: None  EBL: 50 mL  IVF:  1500 mL crystalloid  Specimens: 1. Right renal mass 2. Right renal cyst wall  Disposition of specimens: Pathology  Intraoperative findings:       1. Warm renal ischemia time: 12 minutes  Drains: 1. # 15 Blake perinephric drain  Indication:  Ralph Garcia is a 48 y.o. year old patient with a right renal mass.  After a thorough review of the management options for their renal mass, they elected to proceed with surgical treatment and the above procedure.  We have discussed the potential benefits and risks of the procedure, side effects of the proposed treatment, the likelihood of the patient achieving the goals of the procedure, and any potential problems that might occur during the procedure or recuperation. Informed consent has been obtained.   Description of procedure:  The patient was taken to the operating room and a general anesthetic was administered. The patient was given preoperative antibiotics, placed in the right modified flank position with care to pad all potential pressure points, and prepped and draped in the usual sterile fashion. Next a preoperative timeout was performed.  A site was selected on the right side of the umbilicus for placement of the camera port. This was placed using a standard open Hassan technique which allowed entry into the peritoneal cavity under direct vision and without difficulty. A 12 mm port was placed and a pneumoperitoneum established. The camera was then used to inspect the abdomen and there was no evidence of any intra-abdominal injuries or other abnormalities. The remaining abdominal ports were then placed. 8 mm robotic ports were placed in the  right upper quadrant, right lower quadrant, and far right lateral abdominal wall. A 12 mm port was placed in the upper midline for laparoscopic assistance. All ports were placed under direct vision without difficulty. The surgical cart was then docked.   Utilizing the cautery scissors, the white line of Toldt was incised allowing the colon to be mobilized medially and the plane between the mesocolon and the anterior layer of Gerota's fascia to be developed and the kidney to be exposed.  The ureter and gonadal vein were identified inferiorly and the ureter was lifted anteriorly off the psoas muscle.  Dissection proceeded superiorly along the gonadal vein until the renal vein was identified.  The renal hilum was then carefully isolated with a combination of blunt and sharp dissection allowing the renal arterial and venous structures to be separated and isolated in preparation for renal hilar vessel clamping. There was a lower pole renal artery and a branching main renal artery. There appeared to be two renal veins at least at the entry into the hilum. 12.5 g of IV mannitol was then administered.   Attention turned to the kidney and the perinephric fat surrounding the renal mass was removed and the kidney was mobilized sufficiently for exposure and resection of the renal mass which was located anteriorly. There was also a separate renal cyst located more superiorly and laterally which was a simple cyst on preoperative imaging.  This cyst was decorticated and the cyst wall was sent to pathology. Cautery was used for hemostasis.  Once the renal mass was properly isolated, preparations were made for resection of the tumor.  Reconstructive sutures were placed  into the abdomen for the renorrhaphy portion of the procedure.  The lower pole renal artery and the lower branch of the main renal artery were then clamped with bulldog clamps.  The tumor was then excised with cold scissor dissection along with an adequate  visible gross margin of normal renal parenchyma. The tumor appeared to be excised without any gross violation of the tumor. The renal collecting system was not entered during removal of the tumor.  A running 3-0 V-lock suture was then brought through the capsule of the kidney and run along the base of the renal defect to provide hemostasis and close any entry into the renal collecting system if present. Weck clips were used to secure this suture outside the renal capsule at the proximal and distal ends. An additional hemostatic agent (Surgiflo) was then placed into the renal defect. A running 2-0 V lock suture was then used to close the capsule of the kidney using a sliding clip technique which resulted in excellent hemostasis.    The bulldog clamps were then removed from the renal hilar vessel(s) and an additional 12.5 g of IV mannitol was administered. Total warm renal ischemia time was 12 minutes. The renal tumor resection site was examined. Hemostasis appeared adequate.   The kidney was placed back into its normal anatomic position and covered with perinephric fat as needed.  A # 32 Blake drain was then brought through the lateral lower port site and positioned in the perinephric space.  It was secured to the skin with a nylon suture. The surgical cart was undocked.  The renal tumor specimen was removed intact within an endopouch retrieval bag via the camera port sites.  The camera port site and the other 12 mm port site were then closed at the fascial level with 0-vicryl suture.  All other laparoscopic/robotic ports were removed under direct vision and the pneumoperitoneum let down with inspection of the operative field performed and hemostasis again confirmed. All incision sites were then injected with local anesthetic and reapproximated at the skin level with 4-0 monocryl subcuticular closures.  Dermabond was applied to the skin.  The patient tolerated the procedure well and without complications.  The  patient was able to be extubated and transferred to the recovery unit in satisfactory condition.  Pryor Curia MD

## 2014-05-22 NOTE — Transfer of Care (Signed)
Immediate Anesthesia Transfer of Care Note  Patient: Ralph Garcia  Procedure(s) Performed: Procedure(s) (LRB): ROBOTIC ASSITED PARTIAL NEPHRECTOMY (Right)  Patient Location: PACU  Anesthesia Type: General  Level of Consciousness: sedated, patient cooperative and responds to stimulation  Airway & Oxygen Therapy: Patient Spontanous Breathing and Patient connected to face mask oxgen  Post-op Assessment: Report given to PACU RN and Post -op Vital signs reviewed and stable  Post vital signs: Reviewed and stable  Complications: No apparent anesthesia complications

## 2014-05-22 NOTE — Interval H&P Note (Signed)
History and Physical Interval Note:  05/22/2014 11:03 AM  Ralph Garcia  has presented today for surgery, with the diagnosis of RIGHT RENAL MASS  The various methods of treatment have been discussed with the patient and family. After consideration of risks, benefits and other options for treatment, the patient has consented to  Procedure(s): ROBOTIC ASSITED PARTIAL NEPHRECTOMY (Right) as a surgical intervention .  The patient's history has been reviewed, patient examined, no change in status, stable for surgery.  I have reviewed the patient's chart and labs.  Questions were answered to the patient's satisfaction.     Gelsey Amyx,LES

## 2014-05-22 NOTE — Discharge Instructions (Signed)

## 2014-05-23 ENCOUNTER — Encounter (HOSPITAL_COMMUNITY): Payer: Self-pay | Admitting: Urology

## 2014-05-23 LAB — BASIC METABOLIC PANEL
BUN: 13 mg/dL (ref 6–23)
CO2: 28 mEq/L (ref 19–32)
Calcium: 8 mg/dL — ABNORMAL LOW (ref 8.4–10.5)
Chloride: 101 mEq/L (ref 96–112)
Creatinine, Ser: 1.02 mg/dL (ref 0.50–1.35)
GFR calc Af Amer: >90 mL/min (ref >90)
GFR, EST NON AFRICAN AMERICAN: 85 mL/min — AB (ref >90)
Glucose, Bld: 104 mg/dL — ABNORMAL HIGH (ref 70–99)
POTASSIUM: 3.7 meq/L (ref 3.7–5.3)
Sodium: 139 mEq/L (ref 137–147)

## 2014-05-23 LAB — CREATININE, FLUID (PLEURAL, PERITONEAL, JP DRAINAGE): Creat, Fluid: 0.9 mg/dL

## 2014-05-23 LAB — HEMOGLOBIN AND HEMATOCRIT, BLOOD
HCT: 37.5 % — ABNORMAL LOW (ref 39.0–52.0)
HEMOGLOBIN: 12 g/dL — AB (ref 13.0–17.0)

## 2014-05-23 MED ORDER — BISACODYL 10 MG RE SUPP
10.0000 mg | Freq: Once | RECTAL | Status: AC
  Administered 2014-05-23: 10 mg via RECTAL
  Filled 2014-05-23: qty 1

## 2014-05-23 MED ORDER — HYDROCODONE-ACETAMINOPHEN 5-325 MG PO TABS
1.0000 | ORAL_TABLET | Freq: Four times a day (QID) | ORAL | Status: DC | PRN
  Administered 2014-05-23 (×2): 2 via ORAL
  Filled 2014-05-23 (×2): qty 2

## 2014-05-23 NOTE — Progress Notes (Signed)
Patient ID: HOMERO HYSON, male   DOB: Jun 27, 1966, 48 y.o.   MRN: [id number]  1 Day Post-Op Subjective: Pt with nausea and emesis this morning. Pain controlled with po pain meds.  Severe nausea with morphine last night.  Objective: Vital signs in last 24 hours: Temp:  [97.3 F (36.3 C)-98.2 F (36.8 C)] 97.9 F (36.6 C) (06/26 0445) Pulse Rate:  [54-73] 54 (06/26 0445) Resp:  [7-18] 16 (06/26 0445) BP: (108-151)/(63-82) 108/71 mmHg (06/26 0445) SpO2:  [99 %-100 %] 100 % (06/26 0445) Weight:  [93.1 kg (205 lb 4 oz)] 93.1 kg (205 lb 4 oz) (06/25 1530)  Intake/Output from previous day: 06/25 0701 - 06/26 0700 In: 2685 [I.V.:2685] Out: 1925 [Urine:1825; Drains:50; Blood:50] Intake/Output this shift:    Physical Exam:  General: Alert and oriented CV: RRR Lungs: Clear Abdomen: Soft, ND, No bowel sounds Incisions: C/D/I Ext: NT, No erythema  Lab Results:  Recent Labs  05/21/14 0940 05/22/14 1448 05/23/14 0441  HGB 13.5 13.5 12.0*  HCT 41.3 40.9 37.5*   BMET  Recent Labs  05/22/14 1448 05/23/14 0441  NA 138 139  K 4.0 3.7  CL 101 101  CO2 26 28  GLUCOSE 101* 104*  BUN 17 13  CREATININE 0.95 1.02  CALCIUM 8.3* 8.0*     Studies/Results: No results found.  Assessment/Plan: POD #1 s/p right RAL partial nephrectomy - Ambulate, IS - Advance diet as tolerated - Monitor renal function - Path pending - D/C catheter - Dulcolax suppository   LOS: 1 day   BORDEN,LES 05/23/2014, 7:14 AM

## 2014-05-23 NOTE — Care Management Note (Signed)
    Page 1 of 1   05/23/2014     1:53:23 PM CARE MANAGEMENT NOTE 05/23/2014  Patient:  Ralph Garcia, Ralph Garcia   Account Number:  [id number]  Date Initiated:  05/23/2014  Documentation initiated by:  Dessa Phi  Subjective/Objective Assessment:   48 Y/O M ADMITTED W/RENAL NEOPLASM.     Action/Plan:   FROM HOME.   Anticipated DC Date:  05/24/2014   Anticipated DC Plan:  [place]  CM consult      Choice offered to / List presented to:             Status of service:  In process, will continue to follow Medicare Important Message given?   (If response is "NO", the following Medicare IM given date fields will be blank) Date Medicare IM given:   Date Additional Medicare IM given:    Discharge Disposition:    Per UR Regulation:  Reviewed for med. necessity/level of care/duration of stay  If discussed at [place] of Stay Meetings, dates discussed:    Comments:  05/23/14 KATHY MAHABIR RN,BSN NCM [phone] POD#1 NEPHRECTOMY.NO ANTICIPATED D/C NEEDS.

## 2014-05-23 NOTE — Progress Notes (Signed)
Patient ID: Ralph Garcia, male   DOB: 1966-11-18, 48 y.o.   MRN: [id number]  Patient doing well overall.  Ambulating and tolerating diet.  However, he has felt chilled tonight.  Lungs: clear Abd: soft, ND Inc: C/D/I Ext: NT, no edema  Pathology:  1. Kidney, radical nephrectomy for tumor, partial, right - CYSTIC CLEAR CELL RENAL CELL CARCINOMA, FURHMAN NUCLEAR GRADE II, 2.3 CM, LIMITED TO KIDNEY PARENCHYMA. - RESECTION MARGINS, NEGATIVE FOR ATYPIA OR MALIGNANCY.   Plan: Reviewed pathology with patient and wife. Encourage IS Check JP cr and likely d/c home tomorrow

## 2014-05-24 LAB — BASIC METABOLIC PANEL
BUN: 11 mg/dL (ref 6–23)
CO2: 26 mEq/L (ref 19–32)
Calcium: 8.3 mg/dL — ABNORMAL LOW (ref 8.4–10.5)
Chloride: 101 mEq/L (ref 96–112)
Creatinine, Ser: 0.99 mg/dL (ref 0.50–1.35)
GFR calc Af Amer: >90 mL/min (ref >90)
GLUCOSE: 87 mg/dL (ref 70–99)
Potassium: 4.1 mEq/L (ref 3.7–5.3)
Sodium: 137 mEq/L (ref 137–147)

## 2014-05-24 NOTE — Progress Notes (Signed)
2 Days Post-Op Subjective: Patient reports feeling much better this morning. He has had a bowel movement has been able to ambulate. His pain level is improved. JP creatinine was 0.9. Output has been relatively minimal  Objective: Vital signs in last 24 hours: Temp:  [98.1 F (36.7 C)-99 F (37.2 C)] 98.1 F (36.7 C) (06/27 0435) Pulse Rate:  [57-82] 77 (06/27 0435) Resp:  [14-20] 14 (06/27 0435) BP: (103-131)/(51-60) 125/60 mmHg (06/27 0435) SpO2:  [96 %-100 %] 96 % (06/27 0435)  Intake/Output from previous day: 06/26 0701 - 06/27 0700 In: 480 [P.O.:480] Out: 790 [Urine:750; Drains:40] Intake/Output this shift:    Physical Exam:  Constitutional: Vital signs reviewed. WD WN in NAD   Eyes: PERRL, No scleral icterus.   Cardiovascular: RRR Pulmonary/Chest: Normal effort Abdominal: Soft. Nondistended. All incisions healing appropriately Genitourinary: Not examined Extremities: No cyanosis or edema   Lab Results:  Recent Labs  05/21/14 0940 05/22/14 1448 05/23/14 0441  HGB 13.5 13.5 12.0*  HCT 41.3 40.9 37.5*   BMET  Recent Labs  05/23/14 0441 05/24/14 0407  NA 139 137  K 3.7 4.1  CL 101 101  CO2 28 26  GLUCOSE 104* 87  BUN 13 11  CREATININE 1.02 0.99  CALCIUM 8.0* 8.3*   No results found for this basename: LABPT, INR,  in the last 72 hours No results found for this basename: LABURIN,  in the last 72 hours Results for orders placed during the hospital encounter of 04/26/13  SURGICAL PCR SCREEN     Status: None   Collection Time    04/26/13  2:45 PM      Result Value Ref Range Status   MRSA, PCR NEGATIVE  NEGATIVE Final   Staphylococcus aureus NEGATIVE  NEGATIVE Final   Comment:            The Xpert SA Assay (FDA     approved for NASAL specimens     in patients over 63 years of age),     is one component of     a comprehensive surveillance     program.  Test performance has     been validated by [employer] for patients greater     than or  equal to 68 year old.     It is not intended     to diagnose infection nor to     guide or monitor treatment.    Studies/Results: No results found.  Assessment/Plan:   Doing well postoperative day 2. JP drain was removed. The patient will be discharged home later this morning.   LOS: 2 days   GRAPEY,DAVID S 05/24/2014, 8:28 AM

## 2014-05-24 NOTE — Discharge Summary (Signed)
Patient ID: Ralph Garcia MRN: [id number] DOB/AGE: Jan 02, 1966 48 y.o.  Admit date: 05/22/2014 Discharge date: 05/24/2014    Discharge Diagnoses:   Present on Admission:  . Renal neoplasm  Consults:  None    Discharge Medications:   Medication List         HYDROcodone-acetaminophen 5-325 MG per tablet  Commonly known as:  NORCO  Take 1-2 tablets by mouth every 6 (six) hours as needed.     levothyroxine 175 MCG tablet  Commonly known as:  SYNTHROID, LEVOTHROID  Take 175 mcg by mouth daily before breakfast.         Significant Diagnostic Studies:  No results found.    Hospital Course:  Active Problems:   Renal neoplasm  Patient underwent robotic-assisted laparoscopic partial nephrectomy on the right kidney. 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 Margins were negative. Patient did well postoperatively. No significant postoperative issue/complications. He was discharged on postoperative day 2. JP drainage was minimal and JP creatinine was within normal limits. At that time he was tolerating by mouth well, ambulating and had a bowel movement. Day of Discharge BP 125/60  Pulse 77  Temp(Src) 98.1 F (36.7 C) (Oral)  Resp 14  Ht 6\' 3"  (1.905 m)  Wt 205 lb 4 oz (93.1 kg)  BMI 25.65 kg/m2  SpO2 96%  Well-developed well-nourished male in no acute distress. Respiratory effort normal Cardiac regular rate and rhythm Abdomen flat with appropriately healing incisions Extremities without edema or tenderness  Results for orders placed during the hospital encounter of 05/22/14 (from the past 24 hour(s))  CREATININE, BODY FLUID     Status: None   Collection Time    05/23/14  9:11 PM      Result Value Ref Range   Creat, Fluid 0.9     Fluid Type-FCRE JP DRAINAGE    BASIC METABOLIC PANEL     Status: Abnormal   Collection Time    05/24/14  4:07 AM      Result Value Ref Range   Sodium 137  137 - 147 mEq/L   Potassium 4.1   3.7 - 5.3 mEq/L   Chloride 101  96 - 112 mEq/L   CO2 26  19 - 32 mEq/L   Glucose, Bld 87  70 - 99 mg/dL   BUN 11  6 - 23 mg/dL   Creatinine, Ser 0.99  0.50 - 1.35 mg/dL   Calcium 8.3 (*) 8.4 - 10.5 mg/dL   GFR calc non Af Amer >90  >90 mL/min   GFR calc Af Amer >90  >90 mL/min

## 2014-05-27 ENCOUNTER — Inpatient Hospital Stay (HOSPITAL_COMMUNITY): Admission: RE | Admit: 2014-05-27 | Payer: 59 | Source: Ambulatory Visit

## 2014-10-15 ENCOUNTER — Ambulatory Visit (HOSPITAL_COMMUNITY)
Admission: RE | Admit: 2014-10-15 | Discharge: 2014-10-15 | Disposition: A | Discharge status: Home / Self Care | Payer: 59 | Source: Ambulatory Visit | Attending: Urology | Admitting: Urology

## 2014-10-15 ENCOUNTER — Other Ambulatory Visit (HOSPITAL_COMMUNITY): Payer: Self-pay | Admitting: Urology

## 2014-10-15 DIAGNOSIS — C61 Malignant neoplasm of prostate: Secondary | ICD-10-CM | POA: Diagnosis not present

## 2014-10-15 DIAGNOSIS — C649 Malignant neoplasm of unspecified kidney, except renal pelvis: Secondary | ICD-10-CM | POA: Diagnosis present

## 2014-10-15 DIAGNOSIS — C73 Malignant neoplasm of thyroid gland: Secondary | ICD-10-CM | POA: Insufficient documentation

## 2014-11-12 ENCOUNTER — Encounter: Payer: Self-pay | Admitting: Hematology & Oncology

## 2014-11-24 ENCOUNTER — Telehealth: Payer: Self-pay | Admitting: Hematology & Oncology

## 2014-11-24 NOTE — Telephone Encounter (Signed)
Pt left message to call, I left him message to call

## 2014-12-02 ENCOUNTER — Ambulatory Visit: Payer: 59 | Admitting: Hematology & Oncology

## 2014-12-02 ENCOUNTER — Ambulatory Visit: Payer: 59

## 2014-12-02 ENCOUNTER — Other Ambulatory Visit: Payer: 59 | Admitting: Lab

## 2015-04-24 ENCOUNTER — Ambulatory Visit (HOSPITAL_COMMUNITY)
Admission: RE | Admit: 2015-04-24 | Discharge: 2015-04-24 | Disposition: A | Discharge status: Home / Self Care | Payer: 59 | Source: Ambulatory Visit | Attending: Urology | Admitting: Urology

## 2015-04-24 ENCOUNTER — Other Ambulatory Visit (HOSPITAL_COMMUNITY): Payer: Self-pay | Admitting: Urology

## 2015-04-24 DIAGNOSIS — C649 Malignant neoplasm of unspecified kidney, except renal pelvis: Secondary | ICD-10-CM

## 2015-04-24 DIAGNOSIS — Z85528 Personal history of other malignant neoplasm of kidney: Secondary | ICD-10-CM | POA: Insufficient documentation

## 2015-08-14 ENCOUNTER — Other Ambulatory Visit: Payer: Self-pay

## 2015-11-06 ENCOUNTER — Ambulatory Visit (HOSPITAL_COMMUNITY)
Admission: RE | Admit: 2015-11-06 | Discharge: 2015-11-06 | Disposition: A | Discharge status: Home / Self Care | Payer: 59 | Source: Ambulatory Visit | Attending: Urology | Admitting: Urology

## 2015-11-06 ENCOUNTER — Other Ambulatory Visit (HOSPITAL_COMMUNITY): Payer: Self-pay | Admitting: Urology

## 2015-11-06 DIAGNOSIS — R911 Solitary pulmonary nodule: Secondary | ICD-10-CM | POA: Diagnosis not present

## 2015-11-06 DIAGNOSIS — C649 Malignant neoplasm of unspecified kidney, except renal pelvis: Secondary | ICD-10-CM

## 2015-11-13 ENCOUNTER — Ambulatory Visit (HOSPITAL_COMMUNITY)
Admission: RE | Admit: 2015-11-13 | Discharge: 2015-11-13 | Disposition: A | Discharge status: Home / Self Care | Payer: 59 | Source: Ambulatory Visit | Attending: Urology | Admitting: Urology

## 2015-11-13 ENCOUNTER — Other Ambulatory Visit (HOSPITAL_COMMUNITY): Payer: Self-pay | Admitting: Urology

## 2015-11-13 DIAGNOSIS — IMO0002 Reserved for concepts with insufficient information to code with codable children: Secondary | ICD-10-CM

## 2015-11-13 DIAGNOSIS — R918 Other nonspecific abnormal finding of lung field: Secondary | ICD-10-CM | POA: Diagnosis not present

## 2016-06-03 ENCOUNTER — Ambulatory Visit (HOSPITAL_COMMUNITY)
Admission: RE | Admit: 2016-06-03 | Discharge: 2016-06-03 | Disposition: A | Discharge status: Home / Self Care | Payer: 59 | Source: Ambulatory Visit | Attending: Urology | Admitting: Urology

## 2016-06-03 ENCOUNTER — Other Ambulatory Visit (HOSPITAL_COMMUNITY): Payer: Self-pay | Admitting: Urology

## 2016-06-03 DIAGNOSIS — C641 Malignant neoplasm of right kidney, except renal pelvis: Secondary | ICD-10-CM

## 2016-12-02 DIAGNOSIS — M2560 Stiffness of unspecified joint, not elsewhere classified: Secondary | ICD-10-CM | POA: Diagnosis not present

## 2016-12-02 DIAGNOSIS — M545 Low back pain: Secondary | ICD-10-CM | POA: Diagnosis not present

## 2016-12-02 DIAGNOSIS — R2689 Other abnormalities of gait and mobility: Secondary | ICD-10-CM | POA: Diagnosis not present

## 2017-02-03 IMAGING — DX DG CHEST 2V
2 series · 2 of 2 positions shown · non-contrast
Comparison: 11/13/2015.

CLINICAL DATA: Kidney cancer.

EXAM:
CHEST  2 VIEW

[chest pa]
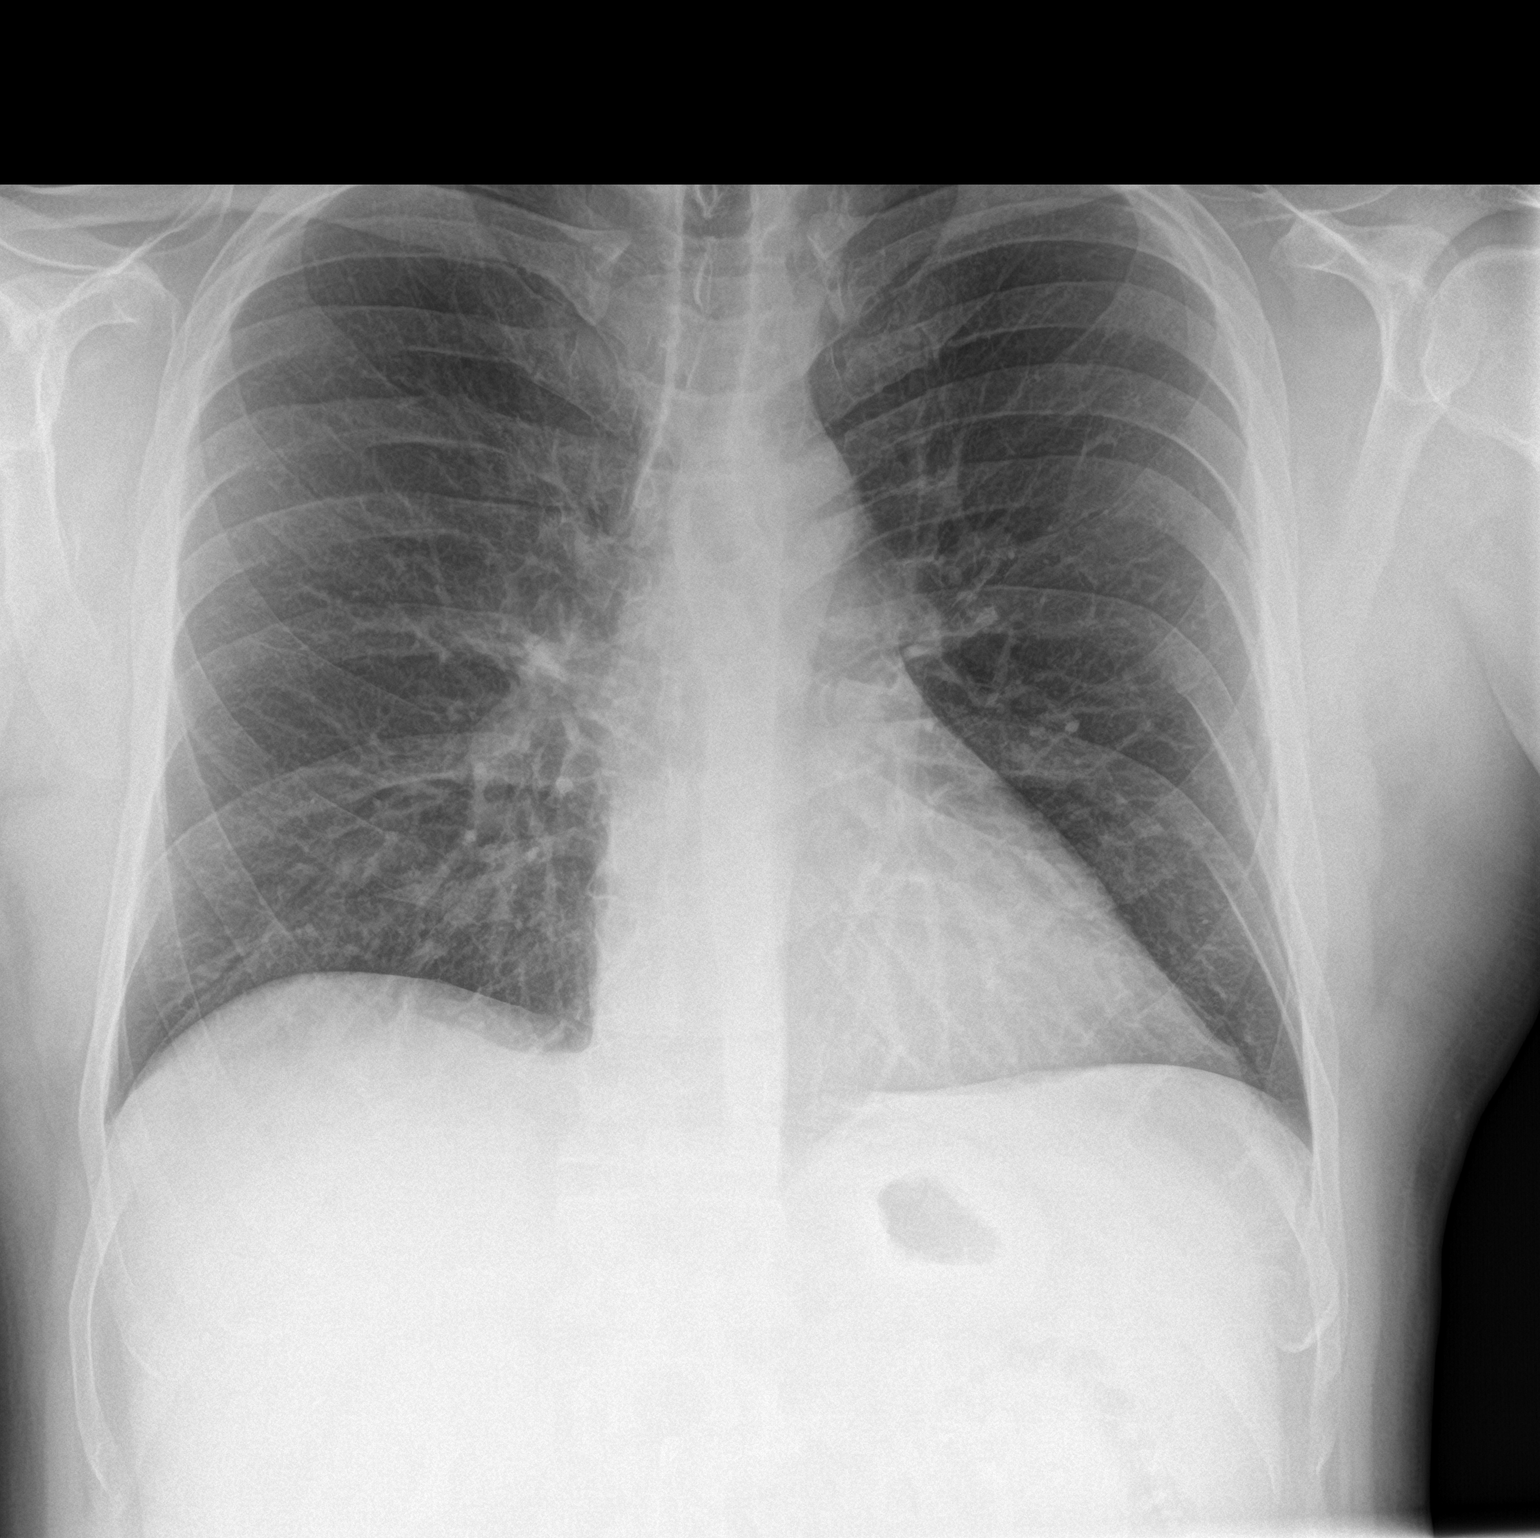

[chest lat]
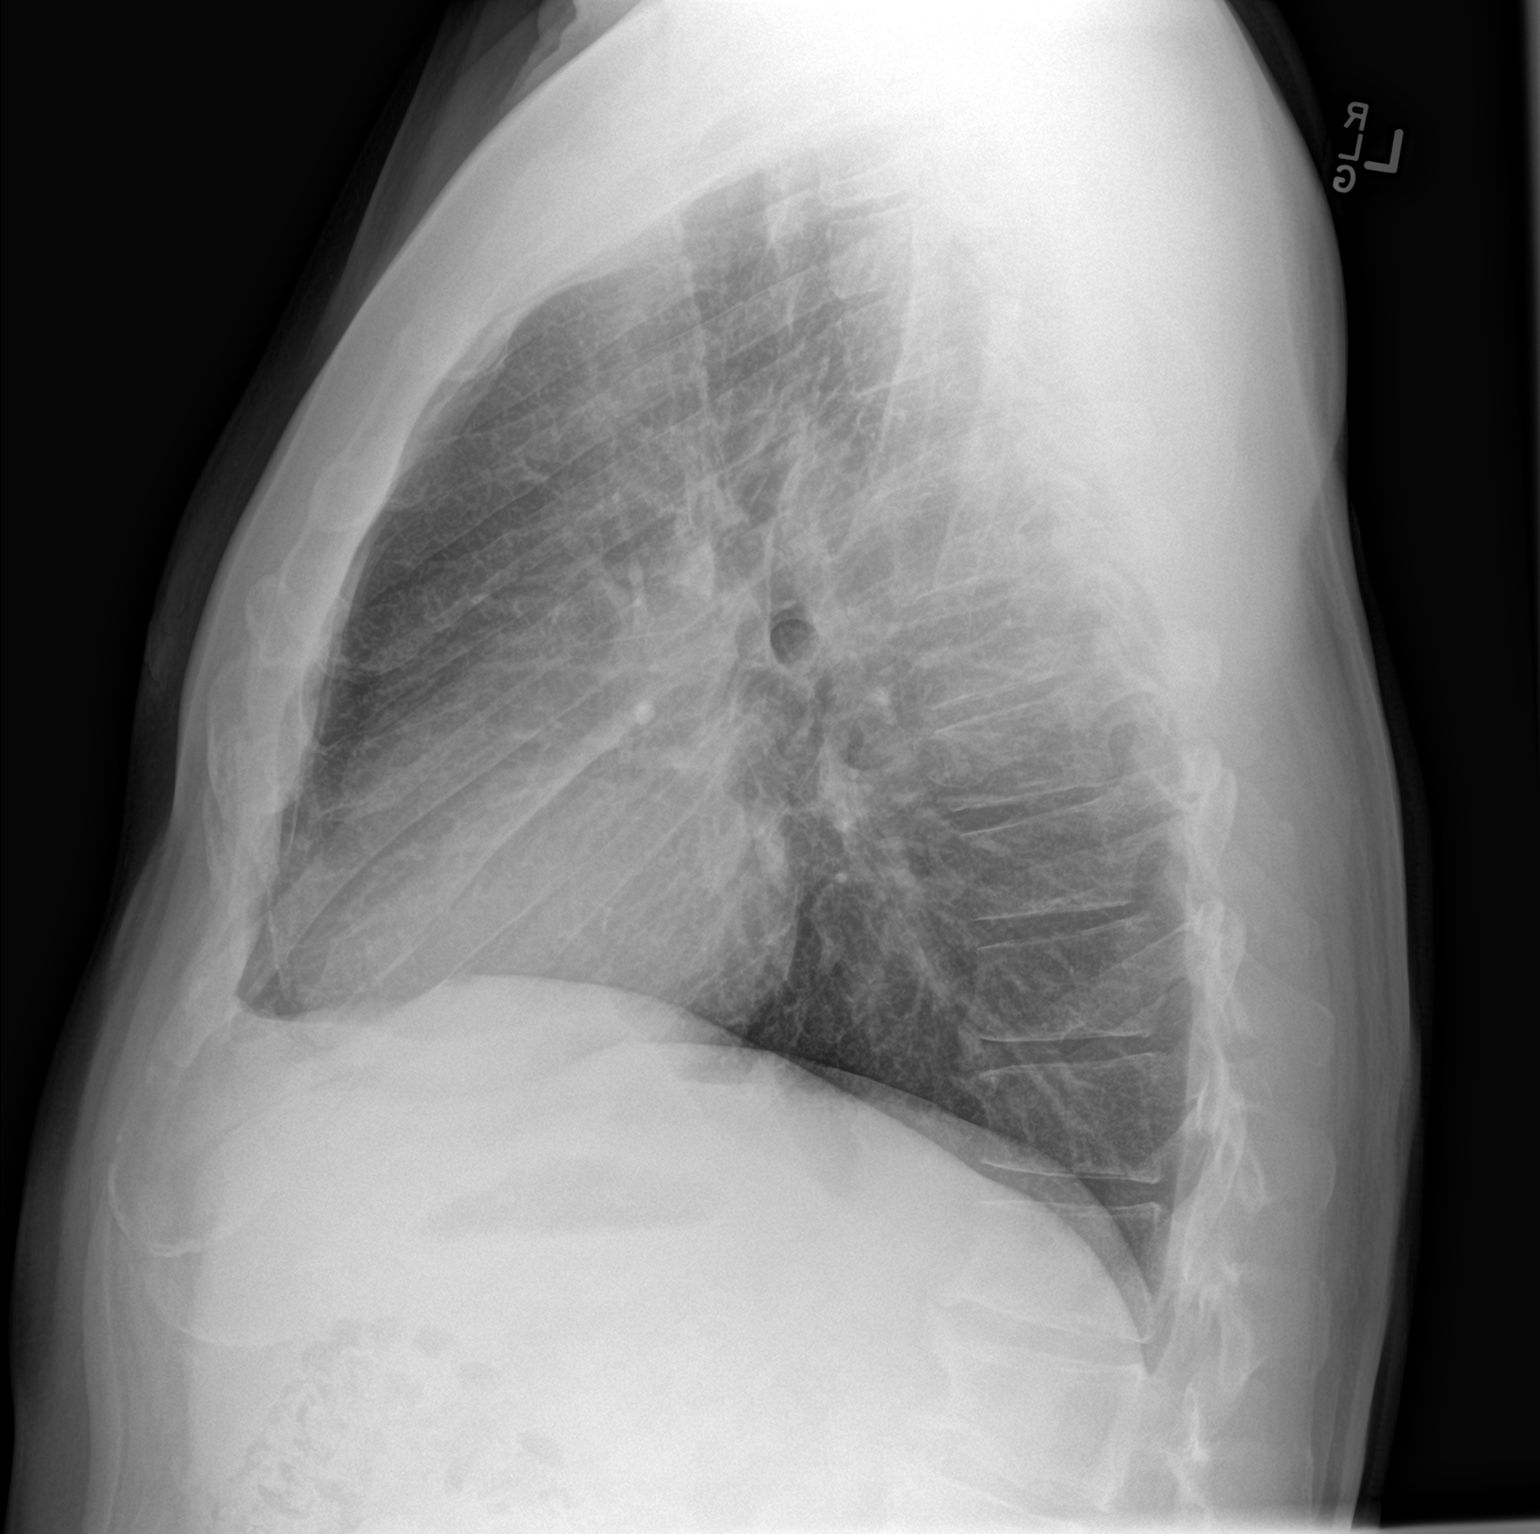

[2 of 2 positions shown; findings below may reference images not displayed]

FINDINGS: Mediastinum hilar structures normal. Lungs are clear. Heart size
normal. No pleural effusion or pneumothorax. No acute bony
abnormality .
IMPRESSION: No acute cardiopulmonary disease.

## 2017-06-19 DIAGNOSIS — Z85528 Personal history of other malignant neoplasm of kidney: Secondary | ICD-10-CM | POA: Diagnosis not present

## 2017-06-19 DIAGNOSIS — K8020 Calculus of gallbladder without cholecystitis without obstruction: Secondary | ICD-10-CM | POA: Diagnosis not present

## 2017-06-19 DIAGNOSIS — R918 Other nonspecific abnormal finding of lung field: Secondary | ICD-10-CM | POA: Diagnosis not present

## 2017-06-23 DIAGNOSIS — Z85528 Personal history of other malignant neoplasm of kidney: Secondary | ICD-10-CM | POA: Diagnosis not present

## 2017-07-04 DIAGNOSIS — Z0000 Encounter for general adult medical examination without abnormal findings: Secondary | ICD-10-CM | POA: Diagnosis not present

## 2017-07-04 DIAGNOSIS — C61 Malignant neoplasm of prostate: Secondary | ICD-10-CM | POA: Diagnosis not present

## 2017-07-04 DIAGNOSIS — E890 Postprocedural hypothyroidism: Secondary | ICD-10-CM | POA: Diagnosis not present

## 2017-07-19 DIAGNOSIS — M25561 Pain in right knee: Secondary | ICD-10-CM | POA: Diagnosis not present

## 2017-07-19 DIAGNOSIS — S8991XA Unspecified injury of right lower leg, initial encounter: Secondary | ICD-10-CM | POA: Diagnosis not present

## 2017-07-19 DIAGNOSIS — S8391XA Sprain of unspecified site of right knee, initial encounter: Secondary | ICD-10-CM | POA: Diagnosis not present

## 2017-08-07 DIAGNOSIS — E890 Postprocedural hypothyroidism: Secondary | ICD-10-CM | POA: Diagnosis not present

## 2017-08-15 DIAGNOSIS — S83421A Sprain of lateral collateral ligament of right knee, initial encounter: Secondary | ICD-10-CM | POA: Diagnosis not present

## 2017-09-20 ENCOUNTER — Ambulatory Visit (INDEPENDENT_AMBULATORY_CARE_PROVIDER_SITE_OTHER): Payer: 59 | Admitting: Pulmonary Disease

## 2017-09-20 ENCOUNTER — Encounter: Payer: Self-pay | Admitting: Pulmonary Disease

## 2017-09-20 VITALS — BP 122/72 | HR 71 | Ht 74.0 in | Wt 230.0 lb

## 2017-09-20 DIAGNOSIS — G4733 Obstructive sleep apnea (adult) (pediatric): Secondary | ICD-10-CM | POA: Diagnosis not present

## 2017-09-20 NOTE — Progress Notes (Signed)
Subjective:    Patient ID: Ralph Garcia, male    DOB: [birth year], 51 y.o.   MRN: [id number]  HPI  51 year old man presents for evaluation of sleep-disordered breathing. He reports non-refreshing sleep and excessive daytime fatigue. His wife has noted loud snoring over the years and witnessed apneas, more so recently. He has a history of thyroid cancer status post thyroidectomy on replacement therapy. He also has a history of prostate and renal cell cancer. 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 Epworth sleepiness score is 8 and reports sleepiness while watching TV and Sundays and lying down to rest in the afternoons or sitting quietly after lunch.  Bedtime is between 9 and 10 PM, sleep latency is about 30 minutes, he sleeps on his left side with 2 pillows, reports 2-3 nocturnal awakenings including nocturia and is out of bed by 4:45 AM feeling tired with occasional dryness of mouth. On Saturdays he wakes even earlier at 4 AM and works until about noon. On Sundays he stays in bed until 5:30 AM. He reports an occasional nap or 15-30 minutes on his couch in the afternoon which may be refreshing. He is gained about 10 pounds in the last 2 years There is no history suggestive of cataplexy, sleep paralysis or parasomnias His snoring is so loud that occasionally his wife has to leave the bedroom   Past Medical History:  Diagnosis Date  . Cancer 12/2010, 03/2013   thyroid, prostate  . Diverticulitis   . Hypothyroidism   . Renal mass, right      Past Surgical History:  Procedure Laterality Date  . ROBOT ASSISTED LAPAROSCOPIC RADICAL PROSTATECTOMY N/A 05/02/2013   Procedure: ROBOTIC ASSISTED LAPAROSCOPIC RADICAL PROSTATECTOMY LEVEL 1;  Surgeon: Dutch Gray, MD;  Location: [facility];  Service: Urology;  Laterality: N/A;  . ROBOTIC ASSITED PARTIAL NEPHRECTOMY Right 05/22/2014   Procedure: ROBOTIC ASSITED PARTIAL NEPHRECTOMY;  Surgeon: Raynelle Bring, MD;  Location: [facility];  Service:  Urology;  Laterality: Right;  . THYROIDECTOMY Bilateral 2012   2 seperate thyroid surgeries  . TONSILLECTOMY     as a child    Allergies  Allergen Reactions  . Erythromycin Swelling  . Neomycin Swelling   Social History   Social History  . Marital status: Married    Spouse name: N/A  . Number of children: N/A  . Years of education: N/A   Occupational History  . Not on file.   Social History Main Topics  . Smoking status: Never Smoker  . Smokeless tobacco: Never Used  . Alcohol use No  . Drug use: No  . Sexual activity: Not on file   Other Topics Concern  . Not on file   Social History Narrative  . No narrative on file    Family History  Problem Relation Age of Onset  . Cancer Mother   . Cancer Father      Review of Systems   Positive for acid heartburn and stiff joints  Constitutional: negative for anorexia, fevers and sweats  Eyes: negative for irritation, redness and visual disturbance  Ears, nose, mouth, throat, and face: negative for earaches, epistaxis, nasal congestion and sore throat  Respiratory: negative for cough, dyspnea on exertion, sputum and wheezing  Cardiovascular: negative for chest pain, dyspnea, lower extremity edema, orthopnea, palpitations and syncope  Gastrointestinal: negative for abdominal pain, constipation, diarrhea, melena, nausea and vomiting  Genitourinary:negative for dysuria, frequency and hematuria  Hematologic/lymphatic: negative for bleeding, easy bruising  and lymphadenopathy  Musculoskeletal:negative for arthralgias, muscle weakness and stiff joints  Neurological: negative for coordination problems, gait problems, headaches and weakness  Endocrine: negative for diabetic symptoms including polydipsia, polyuria and weight loss     Objective:   Physical Exam   Gen. Pleasant, tall,  in no distress, normal affect ENT - no lesions, no post nasal drip, class 2 airway Neck: No JVD, no thyromegaly, no carotid bruits Lungs:  no use of accessory muscles, no dullness to percussion, decreased without rales or rhonchi  Cardiovascular: Rhythm regular, heart sounds  normal, no murmurs or gallops, no peripheral edema Abdomen: soft and non-tender, no hepatosplenomegaly, BS normal. Musculoskeletal: No deformities, no cyanosis or clubbing Neuro:  alert, non focal, no tremors        Assessment & Plan:

## 2017-09-20 NOTE — Assessment & Plan Note (Signed)
Given excessive daytime somnolence, narrow pharyngeal exam, witnessed apneas & loud snoring, obstructive sleep apnea is very likely & an overnight polysomnogram will be scheduled as a home study. The pathophysiology of obstructive sleep apnea , it's cardiovascular consequences & modes of treatment including CPAP were discused with the patient in detail & they evidenced understanding.  Pretest probability is intermediate  Home sleep study will be scheduled History on this we will decide whether you need CPAP therapy

## 2017-09-20 NOTE — Patient Instructions (Signed)
Home sleep study will be scheduled History on this we will decide whether you need CPAP therapy

## 2017-10-02 DIAGNOSIS — G4733 Obstructive sleep apnea (adult) (pediatric): Secondary | ICD-10-CM | POA: Diagnosis not present

## 2017-10-03 DIAGNOSIS — G4733 Obstructive sleep apnea (adult) (pediatric): Secondary | ICD-10-CM

## 2017-10-04 ENCOUNTER — Telehealth: Payer: Self-pay | Admitting: Pulmonary Disease

## 2017-10-04 NOTE — Telephone Encounter (Signed)
Per RA, HST shows mild OSA with 8 events per hour. Options include: not ordering a CPAP and focus on weight loss, CPAP machine, or oral appliance device.   If patient prefers, set up an OV with a NP to discuss options further.

## 2017-10-06 ENCOUNTER — Other Ambulatory Visit: Payer: Self-pay | Admitting: *Deleted

## 2017-10-06 DIAGNOSIS — G4733 Obstructive sleep apnea (adult) (pediatric): Secondary | ICD-10-CM

## 2017-10-11 NOTE — Telephone Encounter (Signed)
Left message for patient to call back for results.  

## 2017-10-11 NOTE — Telephone Encounter (Signed)
Pt is aware of results and voiced his understanding.  Pt states he would like to come in for OV to discuss therapy further. Apt has been made for 10/13/17 with TP. Nothing further needed.

## 2017-10-11 NOTE — Telephone Encounter (Signed)
Patient returned call, CB is [phone].

## 2017-10-13 ENCOUNTER — Ambulatory Visit: Payer: 59 | Admitting: Adult Health

## 2017-10-13 ENCOUNTER — Encounter: Payer: Self-pay | Admitting: Adult Health

## 2017-10-13 VITALS — BP 124/64 | HR 74 | Ht 75.0 in | Wt 234.0 lb

## 2017-10-13 DIAGNOSIS — G4733 Obstructive sleep apnea (adult) (pediatric): Secondary | ICD-10-CM

## 2017-10-13 NOTE — Patient Instructions (Signed)
Begin CPAP At bedtime   Wear for at least 4hr each night .  Do not drive is sleepy .  Work on healthy weight .  Follow up Dr. Elsworth Soho  Or Particia Strahm NP in 2 months and As needed

## 2017-10-13 NOTE — Progress Notes (Signed)
@Patient  ID: Ralph Garcia, male    DOB: 1966/01/26, 51 y.o.   MRN: [id number]  Chief Complaint  Patient presents with  . Follow-up    OSA    Referring provider: [name], MD  HPI: 51 yo male never smoker seen for sleep consult 09/20/17 for daytime sleepiness found to have mild OSA  Hx of Thyroid , Prostate , and Kidney cancer .   TEST  HST 09/2017 >HAI 8/hr .   10/13/2017 Follow up ; OSA  Pt returns for follow up for sleep apnea. He was seen for sleep consult last visit for daytime sleepiness . Set up for a HST that showed mild OSA. With AHI at 8/hr . We discussed study results and treatment options for weight loss , oral appliance and CPAP he would  Like to proceed with CPAP due to significant daytime sleepiness.     Allergies  Allergen Reactions  . Erythromycin Swelling  . Neomycin Swelling     There is no immunization history on file for this patient.  Past Medical History:  Diagnosis Date  . Cancer ([place]) 12/2010, 03/2013   thyroid, prostate  . Chronic kidney disease   . Diverticulitis   . Hypothyroidism   . Renal mass, right     Tobacco History: Social History   Tobacco Use  Smoking Status Never Smoker  Smokeless Tobacco Never Used   Counseling given: Not Answered   Outpatient Encounter Medications as of 10/13/2017  Medication Sig  . HYDROcodone-acetaminophen (NORCO) 5-325 MG per tablet Take 1-2 tablets by mouth every 6 (six) hours as needed.  Marland Kitchen levothyroxine (SYNTHROID, LEVOTHROID) 150 MCG tablet Take 150 mcg daily before breakfast by mouth.    No facility-administered encounter medications on file as of 10/13/2017.      Review of Systems  Constitutional:   No  weight loss, night sweats,  Fevers, chills, + fatigue, or  lassitude.  HEENT:   No headaches,  Difficulty swallowing,  Tooth/dental problems, or  Sore throat,                No sneezing, itching, ear ache, nasal congestion, post nasal drip,   CV:  No chest pain,  Orthopnea,  PND, swelling in lower extremities, anasarca, dizziness, palpitations, syncope.   GI  No heartburn, indigestion, abdominal pain, nausea, vomiting, diarrhea, change in bowel habits, loss of appetite, bloody stools.   Resp: No shortness of breath with exertion or at rest.  No excess mucus, no productive cough,  No non-productive cough,  No coughing up of blood.  No change in color of mucus.  No wheezing.  No chest wall deformity  Skin: no rash or lesions.  GU: no dysuria, change in color of urine, no urgency or frequency.  No flank pain, no hematuria   MS:  No joint pain or swelling.  No decreased range of motion.  No back pain.    Physical Exam  BP 124/64 (BP Location: Left Arm, Cuff Size: Normal)   Pulse 74   Ht 6\' 3"  (1.905 m)   Wt 234 lb (106.1 kg)   SpO2 98%   BMI 29.25 kg/m   GEN: A/Ox3; pleasant , NAD, well nourished    HEENT:  [place]/AT,  EACs-clear, TMs-wnl, NOSE-clear, THROAT-clear, no lesions, no postnasal drip or exudate noted. Class 2-3 MP airway   NECK:  Supple w/ fair ROM; no JVD; normal carotid impulses w/o bruits; no thyromegaly or nodules palpated; no lymphadenopathy.    RESP  Clear  P & A; w/o, wheezes/ rales/ or rhonchi. no accessory muscle use, no dullness to percussion  CARD:  RRR, no m/r/g, no peripheral edema, pulses intact, no cyanosis or clubbing.  GI:   Soft & nt; nml bowel sounds; no organomegaly or masses detected.   Musco: Warm bil, no deformities or joint swelling noted.   Neuro: alert, no focal deficits noted.    Skin: Warm, no lesions or rashes    Lab Results:   BNP No results found for: BNP  ProBNP No results found for: PROBNP  Imaging: No results found.   Assessment & Plan:   OSA (obstructive sleep apnea) Mild OSA  Pt education   Plan  Patient Instructions  Begin CPAP At bedtime   Wear for at least 4hr each night .  Do not drive is sleepy .  Work on healthy weight .  Follow up Dr. Elsworth Soho  Or Tasman Zapata NP in 2 months and As  needed         Rexene Edison, NP 10/13/2017

## 2017-10-13 NOTE — Assessment & Plan Note (Signed)
Mild OSA  Pt education   Plan  Patient Instructions  Begin CPAP At bedtime   Wear for at least 4hr each night .  Do not drive is sleepy .  Work on healthy weight .  Follow up Dr. Elsworth Soho  Or Deema Juncaj NP in 2 months and As needed

## 2017-10-27 DIAGNOSIS — E890 Postprocedural hypothyroidism: Secondary | ICD-10-CM | POA: Diagnosis not present

## 2017-10-31 DIAGNOSIS — E890 Postprocedural hypothyroidism: Secondary | ICD-10-CM | POA: Diagnosis not present

## 2017-10-31 DIAGNOSIS — Z85850 Personal history of malignant neoplasm of thyroid: Secondary | ICD-10-CM | POA: Diagnosis not present

## 2017-11-07 DIAGNOSIS — G4733 Obstructive sleep apnea (adult) (pediatric): Secondary | ICD-10-CM | POA: Diagnosis not present

## 2017-12-08 DIAGNOSIS — G4733 Obstructive sleep apnea (adult) (pediatric): Secondary | ICD-10-CM | POA: Diagnosis not present

## 2017-12-14 ENCOUNTER — Ambulatory Visit: Payer: 59 | Admitting: Pulmonary Disease

## 2017-12-14 ENCOUNTER — Encounter: Payer: Self-pay | Admitting: Pulmonary Disease

## 2017-12-14 DIAGNOSIS — G4733 Obstructive sleep apnea (adult) (pediatric): Secondary | ICD-10-CM

## 2017-12-14 NOTE — Addendum Note (Signed)
Addended by: Maryanna Shape A on: 12/14/2017 03:05 PM   Modules accepted: Orders

## 2017-12-14 NOTE — Assessment & Plan Note (Signed)
Change auto CPAP settings to 5-10 cm. CPAP supplies will be renewed for a year CPAP is working well and has really helped improve his daytime somnolence and fatigue  Weight loss encouraged, compliance with goal of at least 4-6 hrs every night is the expectation. Advised against medications with sedative side effects Cautioned against driving when sleepy - understanding that sleepiness will vary on a day to day basis

## 2017-12-14 NOTE — Progress Notes (Signed)
   Subjective:    Patient ID: Ralph Garcia, male    DOB: August 15, 1966, 52 y.o.   MRN: [id number]  HPI  52 yo for FU of  mild OSA  Hx of Thyroid , Prostate , and Kidney cancer .   He was started on CPAP in December 2018, initially with nasal pillows and then he switched to a full facemask.  He struggled for the first 2 weeks but has now settled down.  He feels that he is resting much better and feels less tired in the daytime.  His wife is much happier and snoring is been abolished. Few times he found that his pressure was running too high machine is blowing too hard. Download was reviewed which shows excellent control of events with average pressure of 8 cm in maximum pressure of 10 cm with good compliance and moderate leak    Significant tests/ events reviewed   HST 09/2017 >HAI 8/hr .   Review of Systems Patient denies significant dyspnea,cough, hemoptysis,  chest pain, palpitations, pedal edema, orthopnea, paroxysmal nocturnal dyspnea, lightheadedness, nausea, vomiting, abdominal or  leg pains      Objective:   Physical Exam  Gen. Pleasant, well-nourished, in no distress ENT - no thrush, no post nasal drip Neck: No JVD, no thyromegaly, no carotid bruits Lungs: no use of accessory muscles, no dullness to percussion, clear without rales or rhonchi  Cardiovascular: Rhythm regular, heart sounds  normal, no murmurs or gallops, no peripheral edema Musculoskeletal: No deformities, no cyanosis or clubbing        Assessment & Plan:

## 2017-12-14 NOTE — Patient Instructions (Signed)
Change auto CPAP settings to 5-10 cm. CPAP supplies will be renewed for a year CPAP is working well

## 2018-01-08 DIAGNOSIS — G4733 Obstructive sleep apnea (adult) (pediatric): Secondary | ICD-10-CM | POA: Diagnosis not present

## 2018-01-17 DIAGNOSIS — G4733 Obstructive sleep apnea (adult) (pediatric): Secondary | ICD-10-CM | POA: Diagnosis not present

## 2018-02-05 DIAGNOSIS — G4733 Obstructive sleep apnea (adult) (pediatric): Secondary | ICD-10-CM | POA: Diagnosis not present

## 2018-03-08 DIAGNOSIS — G4733 Obstructive sleep apnea (adult) (pediatric): Secondary | ICD-10-CM | POA: Diagnosis not present

## 2018-03-13 DIAGNOSIS — E890 Postprocedural hypothyroidism: Secondary | ICD-10-CM | POA: Diagnosis not present

## 2018-03-14 DIAGNOSIS — L03012 Cellulitis of left finger: Secondary | ICD-10-CM | POA: Diagnosis not present

## 2018-03-14 DIAGNOSIS — R21 Rash and other nonspecific skin eruption: Secondary | ICD-10-CM | POA: Diagnosis not present

## 2018-04-07 DIAGNOSIS — G4733 Obstructive sleep apnea (adult) (pediatric): Secondary | ICD-10-CM | POA: Diagnosis not present

## 2018-04-27 DIAGNOSIS — G4733 Obstructive sleep apnea (adult) (pediatric): Secondary | ICD-10-CM | POA: Diagnosis not present

## 2018-05-04 DIAGNOSIS — J208 Acute bronchitis due to other specified organisms: Secondary | ICD-10-CM | POA: Diagnosis not present

## 2018-05-08 DIAGNOSIS — G4733 Obstructive sleep apnea (adult) (pediatric): Secondary | ICD-10-CM | POA: Diagnosis not present

## 2018-06-07 DIAGNOSIS — G4733 Obstructive sleep apnea (adult) (pediatric): Secondary | ICD-10-CM | POA: Diagnosis not present

## 2018-06-19 DIAGNOSIS — Z85528 Personal history of other malignant neoplasm of kidney: Secondary | ICD-10-CM | POA: Diagnosis not present

## 2018-06-19 DIAGNOSIS — Z8546 Personal history of malignant neoplasm of prostate: Secondary | ICD-10-CM | POA: Diagnosis not present

## 2018-06-22 DIAGNOSIS — C649 Malignant neoplasm of unspecified kidney, except renal pelvis: Secondary | ICD-10-CM | POA: Diagnosis not present

## 2018-06-22 DIAGNOSIS — Z85528 Personal history of other malignant neoplasm of kidney: Secondary | ICD-10-CM | POA: Diagnosis not present

## 2018-06-26 DIAGNOSIS — D381 Neoplasm of uncertain behavior of trachea, bronchus and lung: Secondary | ICD-10-CM | POA: Diagnosis not present

## 2018-06-26 DIAGNOSIS — Z85528 Personal history of other malignant neoplasm of kidney: Secondary | ICD-10-CM | POA: Diagnosis not present

## 2018-07-07 DIAGNOSIS — Z0000 Encounter for general adult medical examination without abnormal findings: Secondary | ICD-10-CM | POA: Diagnosis not present

## 2018-07-07 DIAGNOSIS — E890 Postprocedural hypothyroidism: Secondary | ICD-10-CM | POA: Diagnosis not present

## 2018-07-07 DIAGNOSIS — G4733 Obstructive sleep apnea (adult) (pediatric): Secondary | ICD-10-CM | POA: Diagnosis not present

## 2018-08-08 DIAGNOSIS — G4733 Obstructive sleep apnea (adult) (pediatric): Secondary | ICD-10-CM | POA: Diagnosis not present

## 2018-08-13 DIAGNOSIS — Z86010 Personal history of colonic polyps: Secondary | ICD-10-CM | POA: Diagnosis not present

## 2018-08-28 DIAGNOSIS — Z86010 Personal history of colonic polyps: Secondary | ICD-10-CM | POA: Diagnosis not present

## 2018-08-28 DIAGNOSIS — Z800 Family history of malignant neoplasm of digestive organs: Secondary | ICD-10-CM | POA: Diagnosis not present

## 2018-08-28 DIAGNOSIS — Z1211 Encounter for screening for malignant neoplasm of colon: Secondary | ICD-10-CM | POA: Diagnosis not present

## 2018-08-28 DIAGNOSIS — Z09 Encounter for follow-up examination after completed treatment for conditions other than malignant neoplasm: Secondary | ICD-10-CM | POA: Diagnosis not present

## 2018-08-28 DIAGNOSIS — K5730 Diverticulosis of large intestine without perforation or abscess without bleeding: Secondary | ICD-10-CM | POA: Diagnosis not present

## 2018-09-07 DIAGNOSIS — G4733 Obstructive sleep apnea (adult) (pediatric): Secondary | ICD-10-CM | POA: Diagnosis not present

## 2018-10-02 DIAGNOSIS — G4733 Obstructive sleep apnea (adult) (pediatric): Secondary | ICD-10-CM | POA: Diagnosis not present

## 2018-10-29 DIAGNOSIS — Z85850 Personal history of malignant neoplasm of thyroid: Secondary | ICD-10-CM | POA: Diagnosis not present

## 2018-10-29 DIAGNOSIS — E890 Postprocedural hypothyroidism: Secondary | ICD-10-CM | POA: Diagnosis not present

## 2018-11-01 DIAGNOSIS — E890 Postprocedural hypothyroidism: Secondary | ICD-10-CM | POA: Diagnosis not present

## 2018-11-01 DIAGNOSIS — Z85850 Personal history of malignant neoplasm of thyroid: Secondary | ICD-10-CM | POA: Diagnosis not present

## 2018-12-14 ENCOUNTER — Ambulatory Visit: Payer: 59 | Admitting: Adult Health

## 2019-01-14 ENCOUNTER — Ambulatory Visit: Payer: 59 | Admitting: Primary Care

## 2019-01-14 ENCOUNTER — Encounter: Payer: Self-pay | Admitting: Primary Care

## 2019-01-14 DIAGNOSIS — G4733 Obstructive sleep apnea (adult) (pediatric): Secondary | ICD-10-CM

## 2019-01-14 NOTE — Progress Notes (Signed)
@Patient  ID: Ralph Garcia, male    DOB: 1966/02/09, 53 y.o.   MRN: [id number]  Chief Complaint  Patient presents with  . Follow-up    has not used for last 4 weeks    Referring provider: [name], MD  HPI: 53 year old male, never smoked. PMH significant for OSA. Patient of Dr. Elsworth Soho, last seen Jan 2019. HST 09/2018 showed AHI 8/hr. Started on CPAP in December 2018. Initially struggled with nasal pillows, switched to full face mask and did much better.  01/14/2019 Patient presents today for follow-up visit OSA. Feels well, no acute complaints. Patient reports that he has not been wearing CPAP because he was waking up 4 hours after falling asleep and was unable to return to sleep. He wanted to try sleeping without his mask to see if it would help. States that he is sleeping fine without CPAP but still snoring some. Reports less stress at work and better hours. He plans on starting to wear CPAP mask again because he did see benefit from use when he was compliant. He plans to work on weight loss as well.   Allergies  Allergen Reactions  . Erythromycin Swelling  . Neomycin Swelling     There is no immunization history on file for this patient.  Past Medical History:  Diagnosis Date  . Cancer ([place]) 12/2010, 03/2013   thyroid, prostate  . Chronic kidney disease   . Diverticulitis   . Hypothyroidism   . Renal mass, right     Tobacco History: Social History   Tobacco Use  Smoking Status Never Smoker  Smokeless Tobacco Never Used   Counseling given: Not Answered   Outpatient Medications Prior to Visit  Medication Sig Dispense Refill  . HYDROcodone-acetaminophen (NORCO) 5-325 MG per tablet Take 1-2 tablets by mouth every 6 (six) hours as needed. 30 tablet 0  . levothyroxine (SYNTHROID, LEVOTHROID) 150 MCG tablet Take 150 mcg daily before breakfast by mouth.      No facility-administered medications prior to visit.     Review of Systems  Review of Systems    Constitutional: Negative.   HENT: Negative.   Respiratory: Negative.   Cardiovascular: Negative.     Physical Exam  BP 110/68 (BP Location: Left Arm, Cuff Size: Normal)   Pulse 94   Ht 6\' 3"  (1.905 m)   Wt 234 lb 6.4 oz (106.3 kg)   SpO2 96%   BMI 29.30 kg/m  Physical Exam Constitutional:      Appearance: He is well-developed.  HENT:     Head: Normocephalic and atraumatic.  Eyes:     Pupils: Pupils are equal, round, and reactive to light.  Neck:     Musculoskeletal: Normal range of motion and neck supple.  Cardiovascular:     Rate and Rhythm: Normal rate and regular rhythm.     Heart sounds: Normal heart sounds.  Pulmonary:     Effort: Pulmonary effort is normal. No respiratory distress.     Breath sounds: Normal breath sounds. No wheezing.  Skin:    General: Skin is warm and dry.     Findings: No erythema or rash.  Neurological:     Mental Status: He is alert and oriented to person, place, and time.  Psychiatric:        Behavior: Behavior normal.        Judgment: Judgment normal.      Lab Results:  CBC    Component Value Date/Time   WBC 9.6  05/21/2014 0940   RBC 4.82 05/21/2014 0940   HGB 12.0 (L) 05/23/2014 0441   HCT 37.5 (L) 05/23/2014 0441   PLT 272 05/21/2014 0940   MCV 85.7 05/21/2014 0940   MCH 28.0 05/21/2014 0940   MCHC 32.7 05/21/2014 0940   RDW 13.2 05/21/2014 0940   LYMPHSABS 3.2 01/13/2011 1630   MONOABS 0.7 01/13/2011 1630   EOSABS 0.3 01/13/2011 1630   BASOSABS 0.1 01/13/2011 1630    BMET    Component Value Date/Time   NA 137 05/24/2014 0407   K 4.1 05/24/2014 0407   CL 101 05/24/2014 0407   CO2 26 05/24/2014 0407   GLUCOSE 87 05/24/2014 0407   BUN 11 05/24/2014 0407   CREATININE 0.99 05/24/2014 0407   CALCIUM 8.3 (L) 05/24/2014 0407   GFRNONAA >90 05/24/2014 0407   GFRAA >90 05/24/2014 0407    BNP No results found for: BNP  ProBNP No results found for: PROBNP  Imaging: No results found.   Assessment & Plan:    OSA (obstructive sleep apnea) - Download showed poor compliance; Usage 10/30 days, 13% >4 hours, 95% pressure 7.9, AHI 2.7  - Instructed patient to resume CPAP, goal of 4-6 hours every night or more - Do not drive if experiencing excessive daytime fatigue or somnolence. Do not take sedation medication or drink alcohol prior to bedtime as these can worsen sleep apnea  - Encouraged weight loss - Follow up in 6 months with Dr. Candise Che, NP 01/14/2019

## 2019-01-14 NOTE — Assessment & Plan Note (Addendum)
-   Download showed poor compliance; Usage 10/30 days, 13% >4 hours, 95% pressure 7.9, AHI 2.7  - Instructed patient to resume CPAP, goal of 4-6 hours every night or more - Do not drive if experiencing excessive daytime fatigue or somnolence. Do not take sedating medication or alcohol prior to bedtime as these can worsen sleep apnea  - Encouraged weight loss - Follow up in 6 months with Dr. Elsworth Soho

## 2019-01-14 NOTE — Patient Instructions (Signed)
Please start wearing CPAP again Goal 4 hours every night or more Do not drive if experiencing excessive daytime fatigue or somnolence Do not take sedation medication or drink alcohol prior to bedtime as these can worsen sleep apnea   Follow up in 6 months with Dr. Elsworth Soho     Sleep Apnea Sleep apnea affects breathing during sleep. It causes breathing to stop for a short time or to become shallow. It can also increase the risk of:  Heart attack.  Stroke.  Being very overweight (obese).  Diabetes.  Heart failure.  Irregular heartbeat. The goal of treatment is to help you breathe normally again. What are the causes? There are three kinds of sleep apnea:  Obstructive sleep apnea. This is caused by a blocked or collapsed airway.  Central sleep apnea. This happens when the brain does not send the right signals to the muscles that control breathing.  Mixed sleep apnea. This is a combination of obstructive and central sleep apnea. The most common cause of this condition is a collapsed or blocked airway. This can happen if:  Your throat muscles are too relaxed.  Your tongue and tonsils are too large.  You are overweight.  Your airway is too small. What increases the risk?  Being overweight.  Smoking.  Having a small airway.  Being older.  Being male.  Drinking alcohol.  Taking medicines to calm yourself (sedatives or tranquilizers).  Having family members with the condition. What are the signs or symptoms?  Trouble staying asleep.  Being sleepy or tired during the day.  Getting angry a lot.  Loud snoring.  Headaches in the morning.  Not being able to focus your mind (concentrate).  Forgetting things.  Less interest in sex.  Mood swings.  Personality changes.  Feelings of sadness (depression).  Waking up a lot during the night to pee (urinate).  Dry mouth.  Sore throat. How is this diagnosed?  Your medical history.  A physical exam.  A  test that is done when you are sleeping (sleep study). The test is most often done in a sleep lab but may also be done at home. How is this treated?   Sleeping on your side.  Using a medicine to get rid of mucus in your nose (decongestant).  Avoiding the use of alcohol, medicines to help you relax, or certain pain medicines (narcotics).  Losing weight, if needed.  Changing your diet.  Not smoking.  Using a machine to open your airway while you sleep, such as: ? An oral appliance. This is a mouthpiece that shifts your lower jaw forward. ? A CPAP device. This device blows air through a mask when you breathe out (exhale). ? An EPAP device. This has valves that you put in each nostril. ? A BPAP device. This device blows air through a mask when you breathe in (inhale) and breathe out.  Having surgery if other treatments do not work. It is important to get treatment for sleep apnea. Without treatment, it can lead to:  High blood pressure.  Coronary artery disease.  In men, not being able to have an erection (impotence).  Reduced thinking ability. Follow these instructions at home: Lifestyle  Make changes that your doctor recommends.  Eat a healthy diet.  Lose weight if needed.  Avoid alcohol, medicines to help you relax, and some pain medicines.  Do not use any products that contain nicotine or tobacco, such as cigarettes, e-cigarettes, and chewing tobacco. If you need help quitting, ask  your doctor. General instructions  Take over-the-counter and prescription medicines only as told by your doctor.  If you were given a machine to use while you sleep, use it only as told by your doctor.  If you are having surgery, make sure to tell your doctor you have sleep apnea. You may need to bring your device with you.  Keep all follow-up visits as told by your doctor. This is important. Contact a doctor if:  The machine that you were given to use during sleep bothers you or does  not seem to be working.  You do not get better.  You get worse. Get help right away if:  Your chest hurts.  You have trouble breathing in enough air.  You have an uncomfortable feeling in your back, arms, or stomach.  You have trouble talking.  One side of your body feels weak.  A part of your face is hanging down. These symptoms may be an emergency. Do not wait to see if the symptoms will go away. Get medical help right away. Call your local emergency services (911 in the [place] Do not drive yourself to the hospital. Summary  This condition affects breathing during sleep.  The most common cause is a collapsed or blocked airway.  The goal of treatment is to help you breathe normally while you sleep. This information is not intended to replace advice given to you by your health care provider. Make sure you discuss any questions you have with your health care provider. Document Released: 08/23/2008 Document Revised: 07/10/2018 Document Reviewed: 07/10/2018 Elsevier Interactive Patient Education  [employer].

## 2019-02-08 ENCOUNTER — Emergency Department (HOSPITAL_BASED_OUTPATIENT_CLINIC_OR_DEPARTMENT_OTHER): Payer: Worker's Compensation

## 2019-02-08 ENCOUNTER — Encounter (HOSPITAL_BASED_OUTPATIENT_CLINIC_OR_DEPARTMENT_OTHER): Payer: Self-pay

## 2019-02-08 ENCOUNTER — Other Ambulatory Visit: Payer: Self-pay

## 2019-02-08 ENCOUNTER — Emergency Department (HOSPITAL_BASED_OUTPATIENT_CLINIC_OR_DEPARTMENT_OTHER)
Admission: EM | Admit: 2019-02-08 | Discharge: 2019-02-08 | Disposition: A | Discharge status: Home / Self Care | Payer: Worker's Compensation | Attending: Emergency Medicine | Admitting: Emergency Medicine

## 2019-02-08 DIAGNOSIS — Z79899 Other long term (current) drug therapy: Secondary | ICD-10-CM | POA: Diagnosis not present

## 2019-02-08 DIAGNOSIS — Z85528 Personal history of other malignant neoplasm of kidney: Secondary | ICD-10-CM | POA: Insufficient documentation

## 2019-02-08 DIAGNOSIS — Z85850 Personal history of malignant neoplasm of thyroid: Secondary | ICD-10-CM | POA: Diagnosis not present

## 2019-02-08 DIAGNOSIS — Z23 Encounter for immunization: Secondary | ICD-10-CM | POA: Insufficient documentation

## 2019-02-08 DIAGNOSIS — S61217A Laceration without foreign body of left little finger without damage to nail, initial encounter: Secondary | ICD-10-CM | POA: Diagnosis not present

## 2019-02-08 DIAGNOSIS — Y990 Civilian activity done for income or pay: Secondary | ICD-10-CM | POA: Diagnosis not present

## 2019-02-08 DIAGNOSIS — E039 Hypothyroidism, unspecified: Secondary | ICD-10-CM | POA: Insufficient documentation

## 2019-02-08 DIAGNOSIS — Z8546 Personal history of malignant neoplasm of prostate: Secondary | ICD-10-CM | POA: Diagnosis not present

## 2019-02-08 DIAGNOSIS — N189 Chronic kidney disease, unspecified: Secondary | ICD-10-CM | POA: Insufficient documentation

## 2019-02-08 DIAGNOSIS — W268XXA Contact with other sharp object(s), not elsewhere classified, initial encounter: Secondary | ICD-10-CM | POA: Diagnosis not present

## 2019-02-08 DIAGNOSIS — Y939 Activity, unspecified: Secondary | ICD-10-CM | POA: Diagnosis not present

## 2019-02-08 DIAGNOSIS — Y9259 Other trade areas as the place of occurrence of the external cause: Secondary | ICD-10-CM | POA: Insufficient documentation

## 2019-02-08 MED ORDER — TETANUS-DIPHTH-ACELL PERTUSSIS 5-2.5-18.5 LF-MCG/0.5 IM SUSP
0.5000 mL | Freq: Once | INTRAMUSCULAR | Status: AC
  Administered 2019-02-08: 0.5 mL via INTRAMUSCULAR
  Filled 2019-02-08: qty 0.5

## 2019-02-08 MED ORDER — LIDOCAINE HCL (PF) 1 % IJ SOLN
5.0000 mL | Freq: Once | INTRAMUSCULAR | Status: DC
Start: 2019-02-08 — End: 2019-02-08
  Filled 2019-02-08: qty 5

## 2019-02-08 MED ORDER — LIDOCAINE HCL (PF) 1 % IJ SOLN
5.0000 mL | Freq: Once | INTRAMUSCULAR | Status: AC
  Administered 2019-02-08: 5 mL
  Filled 2019-02-08: qty 5

## 2019-02-08 NOTE — ED Notes (Signed)
Pt finger lac soaked in iodine/sterile water solution- cleansed with SafeCleanse. Pt tdap not UTD. Pt risk management rep at bedside.

## 2019-02-08 NOTE — ED Triage Notes (Addendum)
Pt cut left pinky finger on metal at work ~3pm-horizontal circular lac noted-bleeding controlled-gauze dsg placed-pt's health/safety person with pt-NAD-steady gait

## 2019-02-08 NOTE — ED Provider Notes (Signed)
[facility] EMERGENCY DEPARTMENT Provider Note   CSN: [id number] Arrival date & time: 02/08/19  1620    History   Chief Complaint Chief Complaint  Patient presents with  . Finger Injury    HPI Ralph Garcia is a 53 y.o. male.     Patient is a 53 year old male who presents with a finger laceration.  He was at work and cut his finger on a piece of metal.  This happened just prior to arrival.  He is unsure when his last tetanus shot was.  He denies any numbness or weakness to the finger.  He has a mild throbbing pain.     Past Medical History:  Diagnosis Date  . Cancer ([place]) 12/2010, 03/2013   thyroid, prostate  . Chronic kidney disease   . Diverticulitis   . Hypothyroidism   . Renal mass, right     Patient Active Problem List   Diagnosis Date Noted  . OSA (obstructive sleep apnea) 09/20/2017  . Renal neoplasm 05/22/2014    Past Surgical History:  Procedure Laterality Date  . ROBOT ASSISTED LAPAROSCOPIC RADICAL PROSTATECTOMY N/A 05/02/2013   Procedure: ROBOTIC ASSISTED LAPAROSCOPIC RADICAL PROSTATECTOMY LEVEL 1;  Surgeon: Dutch Gray, MD;  Location: [facility];  Service: Urology;  Laterality: N/A;  . ROBOTIC ASSITED PARTIAL NEPHRECTOMY Right 05/22/2014   Procedure: ROBOTIC ASSITED PARTIAL NEPHRECTOMY;  Surgeon: Raynelle Bring, MD;  Location: [facility];  Service: Urology;  Laterality: Right;  . THYROIDECTOMY Bilateral 2012   2 seperate thyroid surgeries  . TONSILLECTOMY     as a child        Home Medications    Prior to Admission medications   Medication Sig Start Date End Date Taking? Authorizing Provider  HYDROcodone-acetaminophen (NORCO) 5-325 MG per tablet Take 1-2 tablets by mouth every 6 (six) hours as needed. 05/22/14   Debbrah Alar, PA-C  levothyroxine (SYNTHROID, LEVOTHROID) 150 MCG tablet Take 150 mcg daily before breakfast by mouth.     [provider]    Family History Family History  Problem Relation Age of Onset  . Cancer Mother   .  Cancer Father     Social History Social History   Tobacco Use  . Smoking status: Never Smoker  . Smokeless tobacco: Never Used  Substance Use Topics  . Alcohol use: No  . Drug use: No     Allergies   Erythromycin and Neomycin   Review of Systems Review of Systems  Constitutional: Negative for fever.  Gastrointestinal: Negative for nausea and vomiting.  Musculoskeletal: Positive for arthralgias. Negative for back pain, joint swelling and neck pain.  Skin: Positive for wound.  Neurological: Negative for weakness, numbness and headaches.     Physical Exam Updated Vital Signs BP 137/90 (BP Location: Right Arm)   Pulse 74   Temp 98.5 F (36.9 C) (Oral)   Resp 18   Ht 6\' 3"  (1.905 m)   Wt 104.3 kg   SpO2 100%   BMI 28.75 kg/m   Physical Exam Constitutional:      Appearance: He is well-developed.  HENT:     Head: Normocephalic and atraumatic.  Neck:     Musculoskeletal: Normal range of motion and neck supple.  Cardiovascular:     Rate and Rhythm: Normal rate.  Pulmonary:     Effort: Pulmonary effort is normal.  Musculoskeletal:        General: No tenderness.     Comments: Patient has a 2 cm laceration to the left fifth  finger.  It is primarily on the volar surface of the finger but does radiate to the lateral side.  There is no active bleeding.  Hwe has good strength on both flexion extension of the finger.  No foreign bodies are identified.  He has some slightly diminished sensation to light touch distally.  Capillary refill is less than 2.  Skin:    General: Skin is warm and dry.  Neurological:     Mental Status: He is alert and oriented to person, place, and time.      ED Treatments / Results  Labs (all labs ordered are listed, but only abnormal results are displayed) Labs Reviewed - No data to display  EKG None  Radiology Dg Finger Little Left  Result Date: 02/08/2019 CLINICAL DATA:  Injured with drill EXAM: LEFT FIFTH FINGER 2+V COMPARISON:   None. FINDINGS: Frontal, oblique, and lateral views obtained. There is overlying bandage in the midportion of the fifth digit. No radiopaque foreign body beyond the bandage. There is no appreciable fracture or dislocation. There is mild osteoarthritic change in the fifth DIP joint. No erosive change. There is a tiny calcification in the fifth PIP joint which is likely of arthropathic etiology. It does not appear to represent fracture. IMPRESSION: Apparent soft tissue injury midportion of the fifth digit. Mild narrowing fifth DIP joint. Tiny calcification in the fifth PIP joint of likely arthropathic etiology. No acute fracture or dislocation. Electronically Signed   By: Lowella Grip III M.D.   On: 02/08/2019 17:02    Procedures .Marland KitchenLaceration Repair Date/Time: 02/08/2019 6:53 PM Performed by: Malvin Johns, MD Authorized by: Malvin Johns, MD   Consent:    Consent obtained:  Verbal   Consent given by:  Patient   Risks discussed:  Infection, need for additional repair, poor cosmetic result, poor wound healing, nerve damage and tendon damage   Alternatives discussed:  No treatment Anesthesia (see MAR for exact dosages):    Anesthesia method:  Local infiltration   Local anesthetic:  Lidocaine 1% w/o epi Laceration details:    Location:  Finger   Finger location:  L small finger   Length (cm):  2 Repair type:    Repair type:  Simple Pre-procedure details:    Preparation:  Patient was prepped and draped in usual sterile fashion and imaging obtained to evaluate for foreign bodies Exploration:    Hemostasis achieved with:  Direct pressure   Wound exploration: wound explored through full range of motion and entire depth of wound probed and visualized     Wound extent: no foreign bodies/material noted, no tendon damage noted, no underlying fracture noted and no vascular damage noted     Contaminated: no   Treatment:    Area cleansed with:  Saline   Amount of cleaning:  Standard    Irrigation solution:  Sterile saline   Irrigation volume:  500   Irrigation method:  Syringe   Visualized foreign bodies/material removed: no   Skin repair:    Repair method:  Sutures   Suture size:  4-0   Suture material:  Prolene   Number of sutures:  8 Approximation:    Approximation:  Close Post-procedure details:    Dressing:  Antibiotic ointment   Patient tolerance of procedure:  Tolerated well, no immediate complications Comments:     Patient has no visible tendon damage.  He does have some duskiness to the wound edges and he was given precautions that this may not heal completely and may have  to heal by secondary intention.  No visualized foreign bodies.   (including critical care time)  Medications Ordered in ED Medications  lidocaine (PF) (XYLOCAINE) 1 % injection 5 mL (has no administration in time range)  Tdap (BOOSTRIX) injection 0.5 mL (has no administration in time range)  lidocaine (PF) (XYLOCAINE) 1 % injection 5 mL (5 mLs Infiltration Given by Other 02/08/19 1747)     Initial Impression / Assessment and Plan / ED Course  I have reviewed the triage vital signs and the nursing notes.  Pertinent labs & imaging results that were available during my care of the patient were reviewed by me and considered in my medical decision making (see chart for details).        Patient was given ongoing wound care instructions.  His tetanus shot was updated.  Return precautions were given.  Final Clinical Impressions(s) / ED Diagnoses   Final diagnoses:  Laceration of left little finger without foreign body without damage to nail, initial encounter    ED Discharge Orders    None       Malvin Johns, MD 02/08/19 1856

## 2019-02-08 NOTE — ED Notes (Signed)
ED Provider at bedside. 

## 2019-02-08 NOTE — ED Notes (Signed)
Pt refused bacitracin on wound- reports allergy to most antibacterial creams. Pt instructed to keep lac clean with soap and water. Wound care instructions reviewed and understood by pt.

## 2019-02-08 NOTE — ED Notes (Signed)
Suture cart at bedside 

## 2019-10-11 IMAGING — CR LEFT LITTLE FINGER 2+V
3 series · 3 of 3 positions shown · non-contrast
Comparison: None.

CLINICAL DATA: Injured with drill

EXAM:
LEFT FIFTH FINGER 2+V

[x finger pa left]
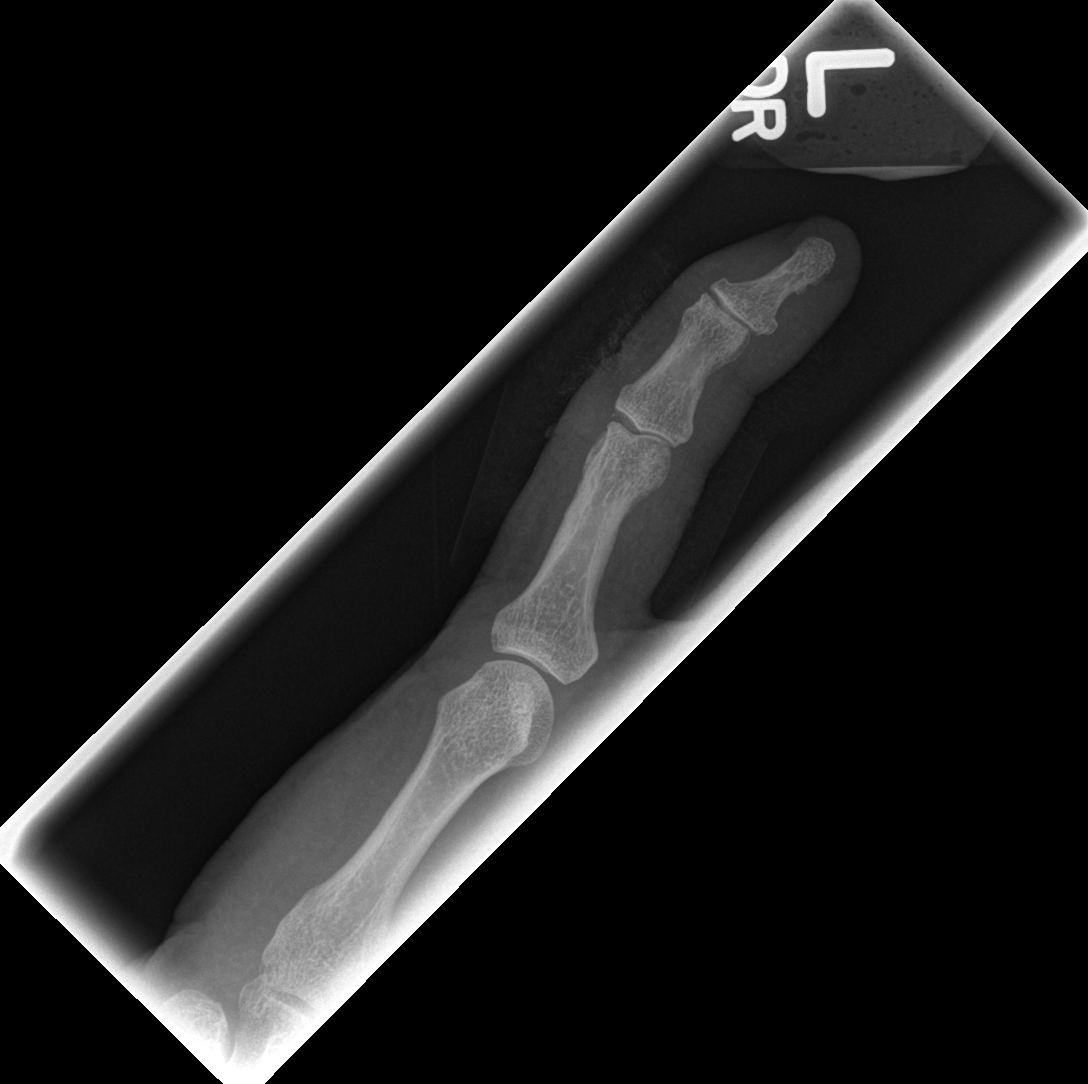

[x finger obl. left]
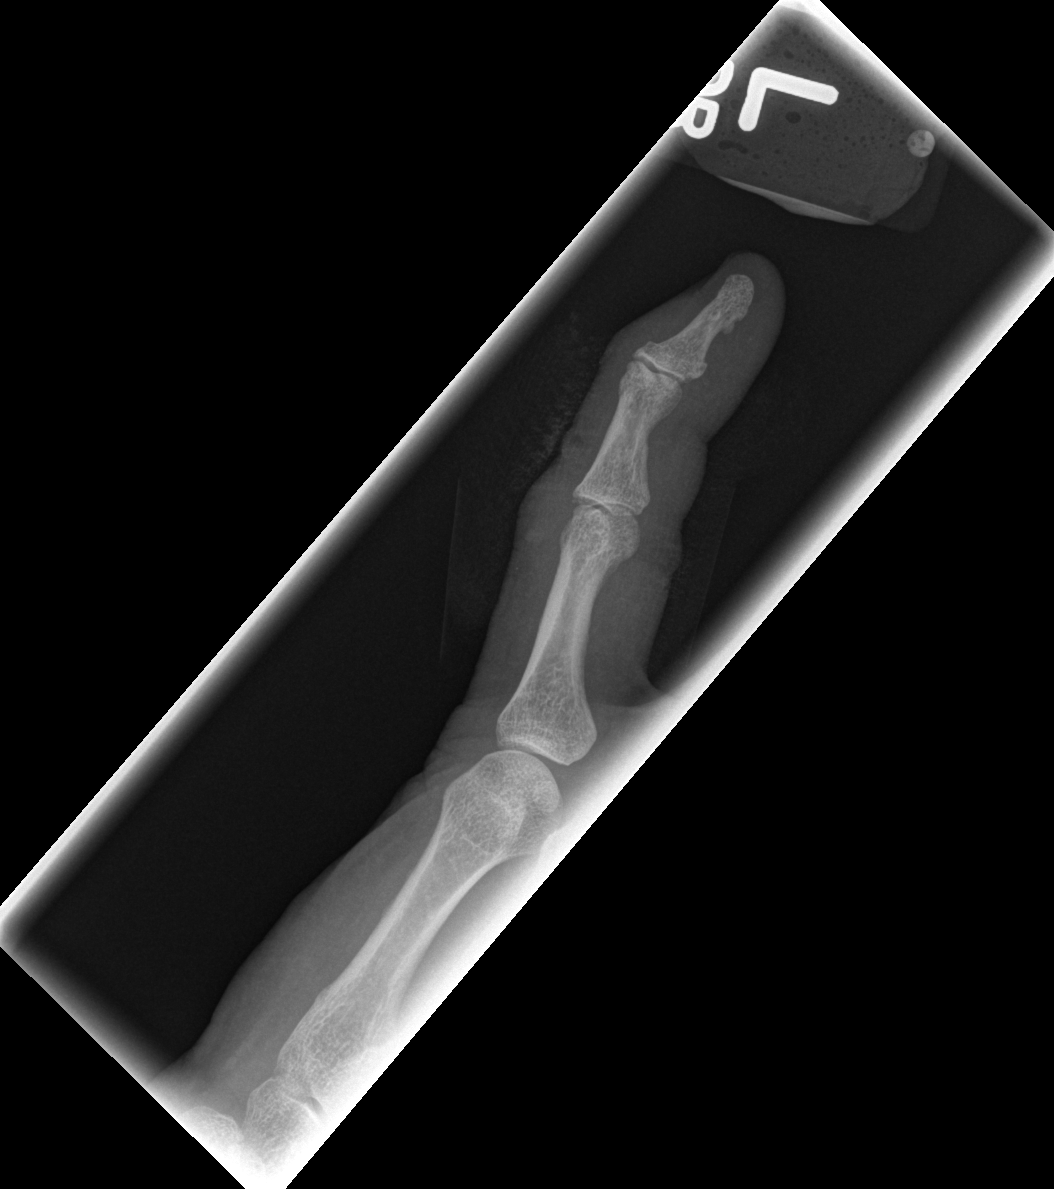

[x finger lateral left]
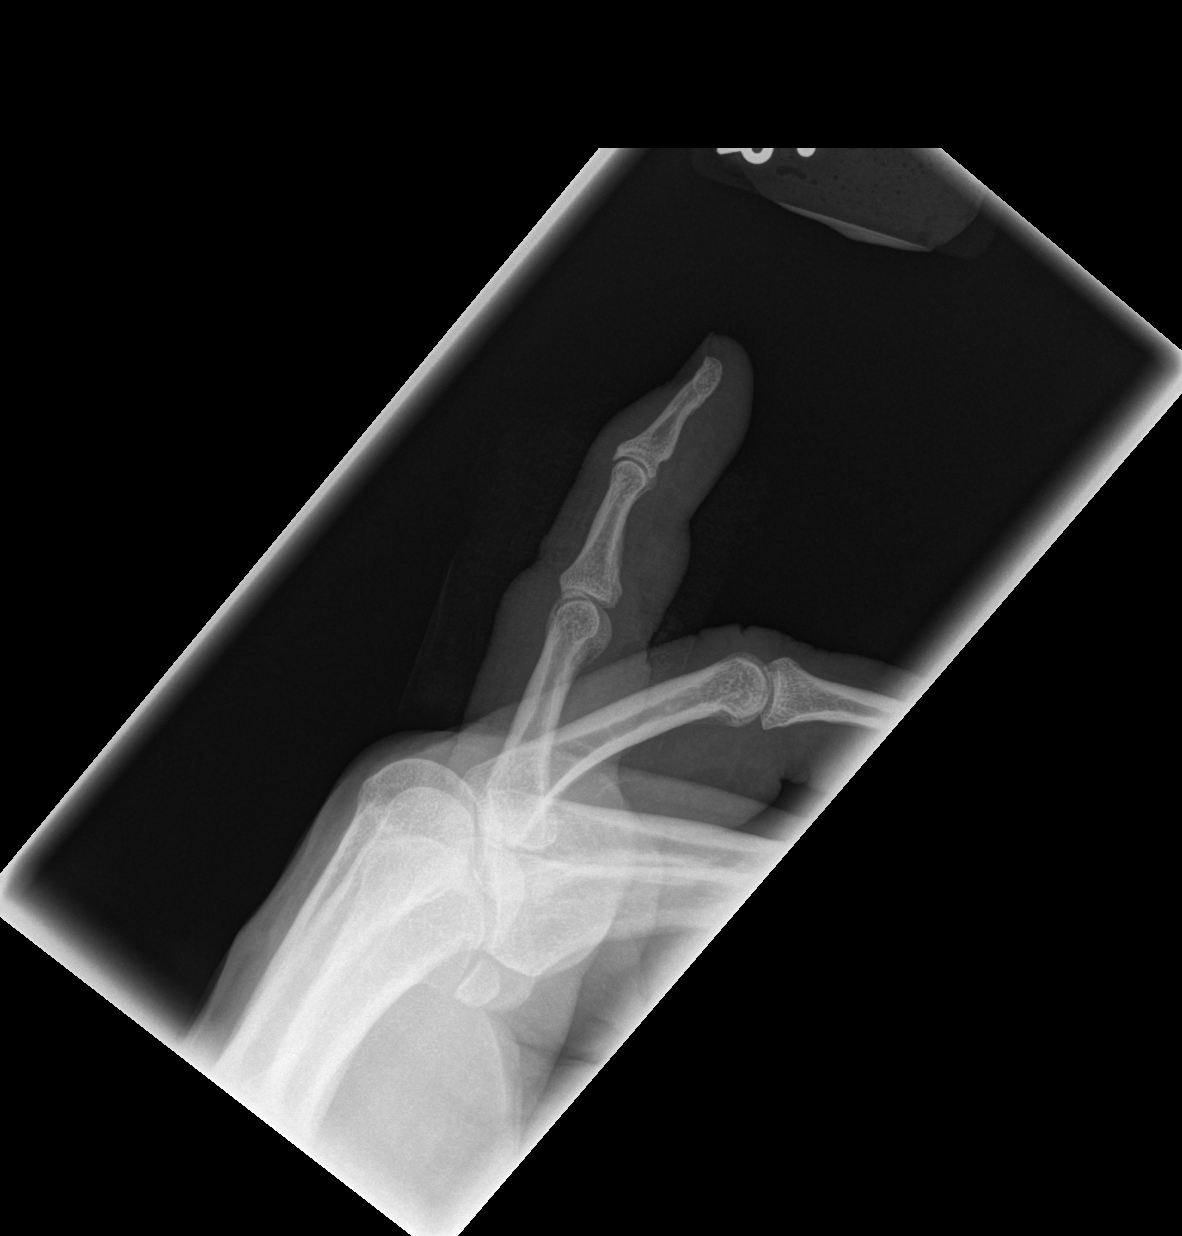

[3 of 3 positions shown; findings below may reference images not displayed]

FINDINGS: Frontal, oblique, and lateral views obtained. There is overlying
bandage in the midportion of the fifth digit. No radiopaque foreign
body beyond the bandage. There is no appreciable fracture or
dislocation. There is mild osteoarthritic change in the fifth DIP
joint. No erosive change. There is a tiny calcification in the fifth
PIP joint which is likely of arthropathic etiology. It does not
appear to represent fracture.
IMPRESSION: Apparent soft tissue injury midportion of the fifth digit. Mild
narrowing fifth DIP joint. Tiny calcification in the fifth PIP joint
of likely arthropathic etiology. No acute fracture or dislocation.

## 2019-12-24 DIAGNOSIS — J029 Acute pharyngitis, unspecified: Secondary | ICD-10-CM | POA: Diagnosis not present

## 2019-12-24 DIAGNOSIS — R6889 Other general symptoms and signs: Secondary | ICD-10-CM | POA: Diagnosis not present

## 2019-12-24 DIAGNOSIS — Z20822 Contact with and (suspected) exposure to covid-19: Secondary | ICD-10-CM | POA: Diagnosis not present

## 2020-02-02 ENCOUNTER — Ambulatory Visit: Payer: Self-pay

## 2020-02-07 ENCOUNTER — Ambulatory Visit: Payer: Self-pay | Attending: Internal Medicine

## 2020-02-07 DIAGNOSIS — Z23 Encounter for immunization: Secondary | ICD-10-CM

## 2020-02-07 NOTE — Progress Notes (Signed)
   Covid-19 Vaccination Clinic  Name:  Ralph Garcia    MRN: [id number] DOB: Jun 13, 1966  02/07/2020  [name] was observed post Covid-19 immunization for 15 minutes without incident. He was provided with Vaccine Information Sheet and instruction to access the V-Safe system.   [name] was instructed to call 911 with any severe reactions post vaccine: Marland Kitchen Difficulty breathing  . Swelling of face and throat  . A fast heartbeat  . A bad rash all over body  . Dizziness and weakness   Immunizations Administered    Name Date Dose VIS Date Route   Pfizer COVID-19 Vaccine 02/07/2020  8:12 AM 0.3 mL 11/08/2019 Intramuscular   Manufacturer: [place]   Lot: [id number]   [place]: [id number]

## 2020-02-17 ENCOUNTER — Ambulatory Visit: Payer: Self-pay

## 2020-03-02 ENCOUNTER — Ambulatory Visit: Payer: Self-pay | Attending: Internal Medicine

## 2020-03-02 DIAGNOSIS — Z23 Encounter for immunization: Secondary | ICD-10-CM

## 2020-03-02 NOTE — Progress Notes (Signed)
   Covid-19 Vaccination Clinic  Name:  Ralph Garcia    MRN: [id number] DOB: 09/12/1966  03/02/2020  Ralph Garcia was observed post Covid-19 immunization for 15 minutes without incident. He was provided with Vaccine Information Sheet and instruction to access the V-Safe system.   Ralph Garcia was instructed to call 911 with any severe reactions post vaccine: Marland Kitchen Difficulty breathing  . Swelling of face and throat  . A fast heartbeat  . A bad rash all over body  . Dizziness and weakness   Immunizations Administered    Name Date Dose VIS Date Route   Pfizer COVID-19 Vaccine 03/02/2020 11:20 AM 0.3 mL 11/08/2019 Intramuscular   Manufacturer: [place]   Lot: [id number]   [place]: [id number]

## 2020-03-23 DIAGNOSIS — L918 Other hypertrophic disorders of the skin: Secondary | ICD-10-CM | POA: Diagnosis not present

## 2020-03-23 DIAGNOSIS — D225 Melanocytic nevi of trunk: Secondary | ICD-10-CM | POA: Diagnosis not present

## 2020-03-23 DIAGNOSIS — D485 Neoplasm of uncertain behavior of skin: Secondary | ICD-10-CM | POA: Diagnosis not present

## 2020-07-04 DIAGNOSIS — Z9989 Dependence on other enabling machines and devices: Secondary | ICD-10-CM | POA: Diagnosis not present

## 2020-07-04 DIAGNOSIS — E890 Postprocedural hypothyroidism: Secondary | ICD-10-CM | POA: Diagnosis not present

## 2020-07-04 DIAGNOSIS — Z0000 Encounter for general adult medical examination without abnormal findings: Secondary | ICD-10-CM | POA: Diagnosis not present

## 2020-07-04 DIAGNOSIS — G4733 Obstructive sleep apnea (adult) (pediatric): Secondary | ICD-10-CM | POA: Diagnosis not present

## 2020-07-08 DIAGNOSIS — Z8546 Personal history of malignant neoplasm of prostate: Secondary | ICD-10-CM | POA: Diagnosis not present

## 2020-11-05 DIAGNOSIS — Z85850 Personal history of malignant neoplasm of thyroid: Secondary | ICD-10-CM | POA: Diagnosis not present

## 2020-11-05 DIAGNOSIS — E890 Postprocedural hypothyroidism: Secondary | ICD-10-CM | POA: Diagnosis not present

## 2021-04-23 DIAGNOSIS — I8310 Varicose veins of unspecified lower extremity with inflammation: Secondary | ICD-10-CM | POA: Diagnosis not present

## 2021-04-30 ENCOUNTER — Other Ambulatory Visit: Payer: Self-pay | Admitting: *Deleted

## 2021-04-30 DIAGNOSIS — M25561 Pain in right knee: Secondary | ICD-10-CM

## 2021-05-03 ENCOUNTER — Ambulatory Visit (HOSPITAL_COMMUNITY)
Admission: RE | Admit: 2021-05-03 | Discharge: 2021-05-03 | Disposition: A | Discharge status: Home / Self Care | Payer: BC Managed Care – PPO | Source: Ambulatory Visit | Attending: Surgery | Admitting: Surgery

## 2021-05-03 ENCOUNTER — Other Ambulatory Visit: Payer: Self-pay

## 2021-05-03 ENCOUNTER — Ambulatory Visit: Payer: BC Managed Care – PPO | Admitting: Physician Assistant

## 2021-05-03 VITALS — BP 116/67 | HR 66 | Temp 98.0°F | Resp 18 | Ht 75.0 in | Wt 229.0 lb

## 2021-05-03 DIAGNOSIS — M25562 Pain in left knee: Secondary | ICD-10-CM | POA: Diagnosis not present

## 2021-05-03 DIAGNOSIS — I83892 Varicose veins of left lower extremities with other complications: Secondary | ICD-10-CM

## 2021-05-03 DIAGNOSIS — M25561 Pain in right knee: Secondary | ICD-10-CM | POA: Insufficient documentation

## 2021-05-03 DIAGNOSIS — I872 Venous insufficiency (chronic) (peripheral): Secondary | ICD-10-CM | POA: Diagnosis not present

## 2021-05-03 NOTE — Progress Notes (Signed)
Office Note     CC:  follow up Requesting Provider:  [name], MD  HPI: Ralph Garcia is a 55 y.o. (06-08-1966) male who presents for evaluation of left lower extremity edema and varicose veins.  He was seen about 2 weeks ago by his primary care physician for the same.  He was recommended 15 to 20 mmHg knee-high compression stockings which she has worn daily since that office visit.  Patient states that his leg feels much better now that he is wearing compression to work.  He has not made an effort to elevate his legs during the day.  He denies any history of DVT, venous ulcerations, trauma, or prior vascular interventions.  He states his mother had varicose veins.  He denies tobacco use.  Surgical history significant for thyroidectomy due to malignancy, prostatectomy due to malignancy, and partial right nephrectomy due to malignancy.  Past Medical History:  Diagnosis Date  . 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 Chronic kidney disease   . Diverticulitis   . Hypothyroidism   . Renal mass, right     Past Surgical History:  Procedure Laterality Date  . ROBOT ASSISTED LAPAROSCOPIC RADICAL PROSTATECTOMY N/A 05/02/2013   Procedure: ROBOTIC ASSISTED LAPAROSCOPIC RADICAL PROSTATECTOMY LEVEL 1;  Surgeon: Dutch Gray, MD;  Location: [facility];  Service: Urology;  Laterality: N/A;  . ROBOTIC ASSITED PARTIAL NEPHRECTOMY Right 05/22/2014   Procedure: ROBOTIC ASSITED PARTIAL NEPHRECTOMY;  Surgeon: Raynelle Bring, MD;  Location: [facility];  Service: Urology;  Laterality: Right;  . THYROIDECTOMY Bilateral 2012   2 seperate thyroid surgeries  . TONSILLECTOMY     as a child    Social History   Socioeconomic History  . Marital status: Married    Spouse name: Not on file  . Number of children: Not on file  . Years of education: Not on file  . Highest education level: Not on file  Occupational History  . Not on file  Tobacco Use  . Smoking status: Never Smoker  . Smokeless tobacco:  Never Used  Vaping Use  . Vaping Use: Never used  Substance and Sexual Activity  . Alcohol use: No  . Drug use: No  . Sexual activity: Not on file  Other Topics Concern  . Not on file  Social History Narrative  . Not on file   Social Determinants of Health   Financial Resource Strain: Not on file  Food Insecurity: Not on file  Transportation Needs: Not on file  Physical Activity: Not on file  Stress: Not on file  Social Connections: Not on file  Intimate Partner Violence: Not on file    Family History  Problem Relation Age of Onset  . Cancer Mother   . Cancer Father     Current Outpatient Medications  Medication Sig Dispense Refill  . fluocinonide cream (LIDEX) 0.05 % Apply topically 2 (two) times daily as needed.    Marland Kitchen HYDROcodone-acetaminophen (NORCO) 5-325 MG per tablet Take 1-2 tablets by mouth every 6 (six) hours as needed. 30 tablet 0  . levothyroxine (SYNTHROID, LEVOTHROID) 150 MCG tablet Take 150 mcg daily before breakfast by mouth.     . methocarbamol (ROBAXIN) 500 MG tablet 1 tablet    . sildenafil (REVATIO) 20 MG tablet TAKE 1 TO 5 TABLETS BY MOUTH AS DIRECTED     No current facility-administered medications for this visit.    Allergies  Allergen Reactions  . Erythromycin Swelling and Other (See Comments)  .  Neomycin Swelling and Other (See Comments)     REVIEW OF SYSTEMS:   [X]  denotes positive finding, [ ]  denotes negative finding Cardiac  Comments:  Chest pain or chest pressure:    Shortness of breath upon exertion:    Short of breath when lying flat:    Irregular heart rhythm:        Vascular    Pain in calf, thigh, or hip brought on by ambulation:    Pain in feet at night that wakes you up from your sleep:     Blood clot in your veins:    Leg swelling:         Pulmonary    Oxygen at home:    Productive cough:     Wheezing:         Neurologic    Sudden weakness in arms or legs:     Sudden numbness in arms or legs:     Sudden onset of  difficulty speaking or slurred speech:    Temporary loss of vision in one eye:     Problems with dizziness:         Gastrointestinal    Blood in stool:     Vomited blood:         Genitourinary    Burning when urinating:     Blood in urine:        Psychiatric    Major depression:         Hematologic    Bleeding problems:    Problems with blood clotting too easily:        Skin    Rashes or ulcers:        Constitutional    Fever or chills:      PHYSICAL EXAMINATION:  Vitals:   05/03/21 0841  BP: 116/67  Pulse: 66  Resp: 18  Temp: 98 F (36.7 C)  TempSrc: Temporal  SpO2: 99%  Weight: 229 lb (103.9 kg)  Height: 6\' 3"  (1.905 m)    General:  WDWN in NAD; vital signs documented above Gait: Not observed HENT: WNL, normocephalic Pulmonary: normal non-labored breathing Cardiac: regular HR Abdomen: soft, NT, no masses Skin: without rashes Vascular Exam/Pulses:  Right Left  Radial 2+ (normal) 2+ (normal)  DP 2+ (normal) 2+ (normal)   Extremities: without ischemic changes, without Gangrene , without cellulitis; without open wounds; varicose veins along path of GSV of LLE from distal thigh to lower leg Musculoskeletal: no muscle wasting or atrophy  Neurologic: A&O X 3;  No focal weakness or paresthesias are detected Psychiatric:  The pt has Normal affect.    Non-Invasive Vascular Imaging:   Negative for DVT L CFV incompetent L GSV incompetent     ASSESSMENT/PLAN:: 55 y.o. male here for evaluation of left lower extremity varicose veins  -Left lower extremity venous reflux study negative for DVT; the left common femoral vein as well as the left greater saphenous vein are incompetent -Patient has had resolution of venous symptoms with knee-high compression use over the past week; patient was also interested in purchasing thigh-high compression.  He was measured for and sold thigh-high compression today -Recommended elevation of his legs periodically during the day  and proper technique was demonstrated -He should also avoid prolonged sitting and standing -Currently the patient is not interested in pursuing laser ablation therapy; he will continue wearing compression regularly and also incorporate other conservative measures.  He will call/return to office for consideration for further work-up if he experiences worsening venous  symptoms   Dagoberto Ligas, PA-C [facility] [phone]  Clinic MD:   Stanford Breed on call

## 2021-07-09 DIAGNOSIS — Z8546 Personal history of malignant neoplasm of prostate: Secondary | ICD-10-CM | POA: Diagnosis not present

## 2021-07-10 DIAGNOSIS — Z0000 Encounter for general adult medical examination without abnormal findings: Secondary | ICD-10-CM | POA: Diagnosis not present

## 2021-11-08 DIAGNOSIS — Z85850 Personal history of malignant neoplasm of thyroid: Secondary | ICD-10-CM | POA: Diagnosis not present

## 2021-11-08 DIAGNOSIS — E890 Postprocedural hypothyroidism: Secondary | ICD-10-CM | POA: Diagnosis not present

## 2022-04-05 DIAGNOSIS — M79672 Pain in left foot: Secondary | ICD-10-CM | POA: Diagnosis not present

## 2022-04-05 DIAGNOSIS — S90862A Insect bite (nonvenomous), left foot, initial encounter: Secondary | ICD-10-CM | POA: Diagnosis not present

## 2022-04-05 DIAGNOSIS — L089 Local infection of the skin and subcutaneous tissue, unspecified: Secondary | ICD-10-CM | POA: Diagnosis not present

## 2022-04-05 DIAGNOSIS — W57XXXA Bitten or stung by nonvenomous insect and other nonvenomous arthropods, initial encounter: Secondary | ICD-10-CM | POA: Diagnosis not present

## 2022-04-05 DIAGNOSIS — L03116 Cellulitis of left lower limb: Secondary | ICD-10-CM | POA: Diagnosis not present

## 2022-04-07 DIAGNOSIS — I808 Phlebitis and thrombophlebitis of other sites: Secondary | ICD-10-CM | POA: Diagnosis not present

## 2022-04-07 DIAGNOSIS — Z79899 Other long term (current) drug therapy: Secondary | ICD-10-CM | POA: Diagnosis not present

## 2022-04-07 DIAGNOSIS — Z23 Encounter for immunization: Secondary | ICD-10-CM | POA: Diagnosis not present

## 2022-04-07 DIAGNOSIS — Z85850 Personal history of malignant neoplasm of thyroid: Secondary | ICD-10-CM | POA: Diagnosis not present

## 2022-04-07 DIAGNOSIS — Z881 Allergy status to other antibiotic agents status: Secondary | ICD-10-CM | POA: Diagnosis not present

## 2022-04-07 DIAGNOSIS — E039 Hypothyroidism, unspecified: Secondary | ICD-10-CM | POA: Diagnosis not present

## 2022-04-07 DIAGNOSIS — Z792 Long term (current) use of antibiotics: Secondary | ICD-10-CM | POA: Diagnosis not present

## 2022-04-07 DIAGNOSIS — I82612 Acute embolism and thrombosis of superficial veins of left upper extremity: Secondary | ICD-10-CM | POA: Diagnosis not present

## 2022-04-07 DIAGNOSIS — L03116 Cellulitis of left lower limb: Secondary | ICD-10-CM | POA: Diagnosis not present

## 2022-04-07 DIAGNOSIS — M7989 Other specified soft tissue disorders: Secondary | ICD-10-CM | POA: Diagnosis not present

## 2022-04-07 DIAGNOSIS — Z85528 Personal history of other malignant neoplasm of kidney: Secondary | ICD-10-CM | POA: Diagnosis not present

## 2022-04-07 DIAGNOSIS — M79675 Pain in left toe(s): Secondary | ICD-10-CM | POA: Diagnosis not present

## 2022-04-07 DIAGNOSIS — M1990 Unspecified osteoarthritis, unspecified site: Secondary | ICD-10-CM | POA: Diagnosis not present

## 2022-04-07 DIAGNOSIS — I82622 Acute embolism and thrombosis of deep veins of left upper extremity: Secondary | ICD-10-CM | POA: Diagnosis not present

## 2022-04-07 DIAGNOSIS — L03032 Cellulitis of left toe: Secondary | ICD-10-CM | POA: Diagnosis not present

## 2022-04-07 DIAGNOSIS — Z8546 Personal history of malignant neoplasm of prostate: Secondary | ICD-10-CM | POA: Diagnosis not present

## 2022-04-07 DIAGNOSIS — Z7901 Long term (current) use of anticoagulants: Secondary | ICD-10-CM | POA: Diagnosis not present

## 2022-04-11 DIAGNOSIS — I82622 Acute embolism and thrombosis of deep veins of left upper extremity: Secondary | ICD-10-CM | POA: Diagnosis not present

## 2022-04-11 DIAGNOSIS — L03032 Cellulitis of left toe: Secondary | ICD-10-CM | POA: Diagnosis not present

## 2022-04-11 DIAGNOSIS — L03116 Cellulitis of left lower limb: Secondary | ICD-10-CM | POA: Diagnosis not present

## 2022-04-11 DIAGNOSIS — L03114 Cellulitis of left upper limb: Secondary | ICD-10-CM | POA: Diagnosis not present

## 2022-04-30 DIAGNOSIS — L0291 Cutaneous abscess, unspecified: Secondary | ICD-10-CM | POA: Diagnosis not present

## 2022-04-30 DIAGNOSIS — L0390 Cellulitis, unspecified: Secondary | ICD-10-CM | POA: Diagnosis not present

## 2022-05-04 DIAGNOSIS — I82612 Acute embolism and thrombosis of superficial veins of left upper extremity: Secondary | ICD-10-CM | POA: Diagnosis not present

## 2022-05-04 DIAGNOSIS — I82622 Acute embolism and thrombosis of deep veins of left upper extremity: Secondary | ICD-10-CM | POA: Diagnosis not present

## 2022-05-04 DIAGNOSIS — I808 Phlebitis and thrombophlebitis of other sites: Secondary | ICD-10-CM | POA: Diagnosis not present

## 2022-05-06 DIAGNOSIS — L03116 Cellulitis of left lower limb: Secondary | ICD-10-CM | POA: Diagnosis not present

## 2022-05-06 DIAGNOSIS — I808 Phlebitis and thrombophlebitis of other sites: Secondary | ICD-10-CM | POA: Diagnosis not present

## 2022-05-06 DIAGNOSIS — R7301 Impaired fasting glucose: Secondary | ICD-10-CM | POA: Diagnosis not present

## 2022-05-06 DIAGNOSIS — I82622 Acute embolism and thrombosis of deep veins of left upper extremity: Secondary | ICD-10-CM | POA: Diagnosis not present

## 2022-07-02 DIAGNOSIS — Z0000 Encounter for general adult medical examination without abnormal findings: Secondary | ICD-10-CM | POA: Diagnosis not present

## 2022-07-06 DIAGNOSIS — C61 Malignant neoplasm of prostate: Secondary | ICD-10-CM | POA: Diagnosis not present

## 2022-11-08 DIAGNOSIS — E890 Postprocedural hypothyroidism: Secondary | ICD-10-CM | POA: Diagnosis not present

## 2022-11-08 DIAGNOSIS — Z85850 Personal history of malignant neoplasm of thyroid: Secondary | ICD-10-CM | POA: Diagnosis not present

## 2022-11-22 DIAGNOSIS — B9689 Other specified bacterial agents as the cause of diseases classified elsewhere: Secondary | ICD-10-CM | POA: Diagnosis not present

## 2022-11-22 DIAGNOSIS — J208 Acute bronchitis due to other specified organisms: Secondary | ICD-10-CM | POA: Diagnosis not present
